# Patient Record
Sex: Female | Born: 1965 | Race: Black or African American | Hispanic: No | Smoking: Never smoker
Health system: Southern US, Community
[De-identification: ages and names within clinical notes are randomized; demographics above are authoritative.]

## PROBLEM LIST (undated history)

## (undated) DIAGNOSIS — I10 Essential (primary) hypertension: Secondary | ICD-10-CM

---

## 2001-08-03 ENCOUNTER — Other Ambulatory Visit: Admission: RE | Admit: 2001-08-03 | Discharge: 2001-08-03 | Payer: Self-pay | Admitting: *Deleted

## 2001-08-30 ENCOUNTER — Encounter (INDEPENDENT_AMBULATORY_CARE_PROVIDER_SITE_OTHER): Payer: Self-pay

## 2001-08-30 ENCOUNTER — Observation Stay (HOSPITAL_COMMUNITY): Admission: AD | Admit: 2001-08-30 | Discharge: 2001-08-31 | Payer: Self-pay | Admitting: *Deleted

## 2001-08-31 ENCOUNTER — Encounter: Payer: Self-pay | Admitting: Obstetrics and Gynecology

## 2002-03-22 ENCOUNTER — Inpatient Hospital Stay (HOSPITAL_COMMUNITY): Admission: AD | Admit: 2002-03-22 | Discharge: 2002-03-25 | Payer: Self-pay | Admitting: *Deleted

## 2002-03-22 ENCOUNTER — Encounter: Payer: Self-pay | Admitting: *Deleted

## 2002-03-24 ENCOUNTER — Encounter (INDEPENDENT_AMBULATORY_CARE_PROVIDER_SITE_OTHER): Payer: Self-pay

## 2003-07-20 ENCOUNTER — Ambulatory Visit (HOSPITAL_COMMUNITY): Admission: RE | Admit: 2003-07-20 | Discharge: 2003-07-20 | Payer: Self-pay | Admitting: Obstetrics

## 2003-08-18 ENCOUNTER — Ambulatory Visit (HOSPITAL_COMMUNITY): Admission: RE | Admit: 2003-08-18 | Discharge: 2003-08-18 | Payer: Self-pay | Admitting: Obstetrics

## 2003-10-03 ENCOUNTER — Ambulatory Visit (HOSPITAL_COMMUNITY): Admission: RE | Admit: 2003-10-03 | Discharge: 2003-10-03 | Payer: Self-pay | Admitting: Obstetrics

## 2003-11-15 ENCOUNTER — Inpatient Hospital Stay (HOSPITAL_COMMUNITY): Admission: AD | Admit: 2003-11-15 | Discharge: 2003-11-26 | Payer: Self-pay | Admitting: Obstetrics

## 2003-11-24 ENCOUNTER — Ambulatory Visit: Payer: Self-pay | Admitting: Neonatology

## 2007-08-19 ENCOUNTER — Emergency Department (HOSPITAL_COMMUNITY): Admission: EM | Admit: 2007-08-19 | Discharge: 2007-08-19 | Payer: Self-pay | Admitting: Emergency Medicine

## 2007-09-10 DIAGNOSIS — I10 Essential (primary) hypertension: Secondary | ICD-10-CM | POA: Insufficient documentation

## 2008-09-18 ENCOUNTER — Emergency Department (HOSPITAL_COMMUNITY): Admission: EM | Admit: 2008-09-18 | Discharge: 2008-09-18 | Payer: Self-pay | Admitting: Emergency Medicine

## 2009-07-11 ENCOUNTER — Ambulatory Visit (HOSPITAL_COMMUNITY): Admission: RE | Admit: 2009-07-11 | Discharge: 2009-07-11 | Payer: Self-pay | Admitting: Obstetrics

## 2009-07-18 ENCOUNTER — Encounter: Admission: RE | Admit: 2009-07-18 | Discharge: 2009-07-18 | Payer: Self-pay | Admitting: Obstetrics

## 2010-04-07 ENCOUNTER — Encounter: Payer: Self-pay | Admitting: Obstetrics

## 2010-08-02 NOTE — Discharge Summary (Signed)
Cynthia Clarke, Cynthia Clarke               ACCOUNT NO.:  [id number]   MEDICAL RECORD NO.:  [id number]          PATIENT TYPE:  INP   LOCATION:  9159                          F[facility]ILITY:  [facility]   PHYSICIAN:  Charles A. Clearance Coots, M.D.DATE OF BIRTH:  [birth year]   DATE OF ADMISSION:  11/15/2003  DATE OF DISCHARGE:  11/26/2003                                 DISCHARGE SUMMARY   ADMITTING DIAGNOSES:  1.  Twenty-four weeks gestation.  2.  Twins.  3.  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  4.  Rule out rupture of membranes.  5.  Preterm cervical changes.   DISCHARGE DIAGNOSES:  1.  Twenty-four weeks gestation.  2.  Twins.  3.  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  4.  Rule out rupture of membranes.  5.  Preterm cervical changes.  6.  Status post rupture of membranes.  Transferred to [facility],      [place].   REASON FOR ADMISSION:  A 45 year old black female G9 P1-0-7-1 at [redacted] weeks  gestation with twin gestation and incompetent cervix.  Cervical cerclage in  place.  Ultrasound on the day of admission revealed funneling of the cervix  and decreased amniotic fluid index of baby A.  The patient had no complaints  of leaking of fluid or uterine contractions.  The patient's obstetrical  history is significant for multiple spontaneous abortions.   PAST MEDICAL HISTORY:  Surgery:  Cerclage in June 2005.  Illnesses:  None.   MEDICATIONS:  Prenatal vitamins, Delalutin.   ALLERGIES:  PENICILLIN.   SOCIAL HISTORY:  Married.  Negative tobacco, alcohol, or recreational drug  use.   PHYSICAL EXAMINATION:  GENERAL:  Well-nourished, well-developed black female  in no acute distress.  VITAL SIGNS:  Temperature 98.5, pulse 85, respiratory rate 20, blood  pressure 128/71.  HEENT:  Normal.  LUNGS:  Clear to auscultation bilaterally.  HEART:  Regular rate and rhythm.  ABDOMEN:  Gravid, nontender.  PELVIC:  Normal external female  genitalia.  Speculum exam revealed no  pooling of amniotic fluid and the discharge that was noted was nitrazine  negative.  The cervix appeared closed on speculum exam and the cerclage  stitch could be seen at the 12 o'clock position.  Group B strep was done.   LABORATORY VALUES:  Hemoglobin 11.4; hematocrit 34; white blood cell count  9900; platelets 213,000.  Urinalysis revealed a trace hemoglobin, otherwise  was within normal limits.   IMPRESSION:  Twenty-four weeks gestation, twins, incompetent cervix, preterm  cervical changes.   PLAN:  Admit, start magnesium sulfate, bedrest.   HOSPITAL COURSE:  The patient was admitted and started on IV magnesium  sulfate, responded quite well to therapy, and actually did well on magnesium  sulfate until hospital day #11, when she got up to go to the bathroom and  noted leaking of brownish-tinged fluid from the vagina.  Sterile speculum  exam revealed pooling of brownish-tinged purulent fluid from the cervix that  was nitrazine positive and fern positive.  The cervix appeared closed and  the cerclage stitch was intact.  The patient was now [redacted] weeks gestation and  the neonatal intensive care unit at the [facility] was full at the  time, and the Physicians Access Line was initiated for transfer of the  patient with twins intact to [facility] not in labor.  The patient was  transferred by EMS to [facility] without complications.   DISCHARGE LABORATORY VALUES:  Urine culture was no growth.  Group B strep  culture was positive.   DISCHARGE DISPOSITION:  The patient transferred to [facility] in good  condition, not in labor, for further management and probable delivery.     Char   CAH/MEDQ  D:  01/03/2004  T:  01/03/2004  Job:  [id number]

## 2010-08-02 NOTE — Op Note (Signed)
[facility]  Patient:    FALEN, LEHRMANN Visit Number: [id number] MRN: [id number]          Service Type: OBS Location: 910B 9151 01 Attending Physician:  Rhina Brackett Dictated by:   Duke Salvia. Marcelle Overlie, M.D. Proc. Date: 08/31/01 Admit Date:  08/30/2001 Discharge Date: 08/31/2001                             Operative Report  PREOPERATIVE DIAGNOSIS:       A 15 week incomplete abortion.  POSTOPERATIVE DIAGNOSIS:      A 15 week incomplete abortion.  OPERATION:                    D&E.  SURGEON:                      Duke Salvia. Marcelle Overlie, M.D.  ANESTHESIA:                   Spinal.  COMPLICATIONS:                None.  DRAINS:                       In and out Foley catheter.  ESTIMATED BLOOD LOSS:         300 including all blood clot.  DESCRIPTION OF PROCEDURE AND FINDINGS:                 The patient was taken to the operating room and after an adequate level of spinal anesthetic was obtained.  The patients legs were placed in stirrups.  The bladder was drained.  EUA was carried out.  The uterus was 14 weeks size and mid position.  The cord was noted at the cervix. After prepping and draping, the cervix was grasped with a tenaculum.  Ring forceps then used to remove a large amount of tissue including most of the placenta intact.  A large Lounsbury curet was then used to curet the cavity. The walls were firm.  A #12 suction curet was then used to remove minimal tissue.  Due to some moderate brisk bleeding, she was Hemabate 250 IM in the operating room, but with good firmness of the uterus.  There were no complications.  She went to the recovery room in good condition. Dictated by:   Duke Salvia. Marcelle Overlie, M.D. Attending Physician:  Rhina Brackett DD:  08/31/01 TD:  09/01/01 Job: [id number]

## 2010-08-02 NOTE — Op Note (Signed)
NAMEMARIAN, MENEELY NO.:  [id number]   MEDICAL RECORD NO.:  [id number]                   PATIENT TYPE:  INP   LOCATION:  9309                                 FACILITY:  [facility]   PHYSICIAN:  Mary Sella. Orlene Erm, M.D.                 DATE OF BIRTH:  06/12/1965   DATE OF PROCEDURE:  03/24/2002  DATE OF DISCHARGE:                                 OPERATIVE REPORT   PREOPERATIVE DIAGNOSIS:  This 45 year old black female with retained  placenta, status post delivery of a nonviable 18-week fetus.   POSTOPERATIVE DIAGNOSIS:  This 45 year old black female with retained  placenta, status post delivery of a nonviable 18-week fetus.   PROCEDURE:  Dilation and curettage.   SURGEON:  Mary Sella. Orlene Erm, M.D.   ANESTHESIA:  MAC.   COMPLICATIONS:  None.   SPECIMENS:  Placenta and products of conception.   ESTIMATED BLOOD LOSS:  150 cc.   DISPOSITION:  Recovery room, stable.   INDICATIONS FOR PROCEDURE:  The patient is a 45 year old gravida 7, para 1-0-  5-1, status post rupture of membranes and delivery of a 18-week fetus.  She  delivered the fetus at approximately 9:30 on the day of surgery and Pitocin  and Cytotec were given to the patient over the ensuing six hours.  She  continued to have a retained placenta without evidence of any separation.  The patient desired definitive therapy with dilatation and curettage.   The patient was counseled on the risks of the procedure including the risk  of bleeding, infection, injury to internal organs, perforation of the  uterus, and Asherman's syndrome.  The patient understands these and wishes  to proceed.   PROCEDURE:  The patient was taken to the operating room where she was given  MAC anesthesia and placed in Allen stirrups.  The speculum was placed in the  vagina and a large mass of the placenta was seen at the cervix.  This was  pieced through the cervix easily and handed off for pathology.  The placenta  did not  appear to be intact and an __________ curette was introduced through  an already dilated cervix and portions of necrotic-appearing placenta were  scraped from the posterior lower uterine segment.  A suction curette was  introduced through the uterine cavity and remained products of conception  were evacuated.  The uterus was massaged and the patient was given  Methergine intraoperatively to minimize further bleeding.  The patient  tolerated the procedure well, was awakened, and taken back to the Adult  Intensive Care Unit for further evaluation and recovery.                                              Mary Sella. Orlene Erm, M.D.   EMH/MEDQ  D:  03/24/2002  T:  03/24/2002  Job:  [id number]

## 2010-08-02 NOTE — Op Note (Signed)
NAME:  Cynthia Clarke, Cynthia Clarke                         ACCOUNT NO.:  [id number]   MEDICAL RECORD NO.:  [id number]                   PATIENT TYPE:  AMB   LOCATION:  SDC                                  [facility]   PHYSICIAN:  Charles A. Clearance Coots, M.D.             DATE OF BIRTH:  29-May-1965   DATE OF PROCEDURE:  08/18/2003  DATE OF DISCHARGE:                                 OPERATIVE REPORT   PREOPERATIVE DIAGNOSIS:  Incompetent cervix.   POSTOPERATIVE DIAGNOSIS:  Incompetent cervix.   PROCEDURE:  McDonald's cervical cerclage.   SURGEON:  Charles A. Clearance Coots, M.D.   ANESTHESIA:  Spinal.   ESTIMATED BLOOD LOSS:  Negligible.   COMPLICATIONS:  None.   SPECIMENS:  None.   OPERATION:  The patient was brought to the operating room and after  satisfactory spinal anesthesia, the vagina was prepped and draped in the  usual sterile fashion.  A sterile weighted speculum was then placed in the  posterior vaginal vault and the cervix was isolated with the aid of a Sims  retractor.  The anterior lip of the cervix was grasped with a small ring  forceps and a McDonald cervical cerclage was then placed near the internal  os of the cervix in routine fashion with #4 double throw of silk suture with  medium needle.  The suture was tied securely at the 12 o'clock position with  several throws of the knot and tail of at least one inch.  There was no  active bleeding or leakage of fluid at the conclusion of the procedure.  The  patient tolerated the procedure well and was transferred to the recovery  room in satisfactory condition.                                               Charles A. Clearance Coots, M.D.    CAH/MEDQ  D:  08/18/2003  T:  08/18/2003  Job:  [id number]

## 2010-08-02 NOTE — H&P (Signed)
[facility]  Patient:    Cynthia Clarke, Cynthia Clarke Visit Number: [id number] MRN: [id number]          Service Type: OBS Location: 910B 9151 01 Attending Physician:  Rhina Brackett Dictated by:   Duke Salvia. Marcelle Overlie, M.D. Admit Date:  08/30/2001 Discharge Date: 08/31/2001                           History and Physical  CHIEF COMPLAINT:              Leaking of fluid and bleeding.  HISTORY OF PRESENT ILLNESS:   A 45 year old G2 P1 presents with a history of leaking and having some leaking watery fluid and some light bleeding at [redacted] weeks gestation.  On presentation to triage the patient was stable, was sent to ultrasound.  In preparation for the ultrasound she was in the bathroom and delivered the fetus in the commode.  Back in triage, an IV was started.  There was minimal bleeding.  She was given a dose of Cytotec and observed over three to four hours with no passage of a placenta.  Decision was made to proceed with D&E at that point.  She has had one term pregnancy.  PAST MEDICAL HISTORY:         Blood type A positive, antibody screen negative. RPR was minimally reactive on Jul 23, 2001.  This was noted on the prenatal record and treated at that time.  REVIEW OF SYSTEMS:            Otherwise negative.  PHYSICAL EXAMINATION:  VITAL SIGNS:                  Temperature 98.2, blood pressure 127/69.  HEENT:                        Unremarkable.  NECK:                         Supple without masses.  LUNGS:                        Clear.  CARDIOVASCULAR:               Regular rate and rhythm without murmurs, rubs, or gallops noted.  BREASTS:                      Not examined.  ABDOMEN:                      The fundus was two fingerbreadths below the symphysis.  PELVIC:                       Cervix revealed the cord was clamped, minimal bleeding.  EXTREMITIES AND NEUROLOGIC:   Unremarkable.  IMPRESSION:                   1. Intrauterine pregnancy  at 16 weeks.                               2. Incomplete abortion.  PLAN:                         D&E. Procedure including infection risk, risk of transfusion, perforation that  may require additional surgery all discussed with her, which she understands and accepts. Dictated by:   Duke Salvia. Marcelle Overlie, M.D. Attending Physician:  Rhina Brackett DD:  08/31/01 TD:  08/31/01 Job: [id number]

## 2010-08-02 NOTE — Discharge Summary (Signed)
   [name], [name] NO.:  [id number]   MEDICAL RECORD NO.:  [id number]                   PATIENT TYPE:  INP   LOCATION:  9309                                 [facility]  [facility]   PHYSICIAN:  Conni Elliot, M.D.             DATE OF BIRTH:  Jun 16, 1965   DATE OF ADMISSION:  03/22/2002  DATE OF DISCHARGE:  03/25/2002                                 DISCHARGE SUMMARY   DISCHARGE DIAGNOSES:  1. Spontaneous abortion at 15-18 weeks secondary to preterm premature     rupture of membranes.  2. Status post dilatation and evacuation.   DISCHARGE MEDICATIONS:  Augmentin 875 mg p.o. b.i.d. x7 days.   BRIEF HOSPITAL COURSE:  This is a 45 year old G7 P1-0-6-1 who presented  between 15-18 weeks with a two-day history of leaking yellow fluid.  She was  found upon admission to have PPROM with a positive fern test.  Consequently,  she was admitted and induction of labor was begun with Cytotec.  On March 24, 2002 at 9:37 a.m. she delivered a nonviable fetus.  The placenta and cord  were attempted to be delivered expectantly but after multiple hours it was  decided that a D&E would be performed.  This procedure was performed on  March 24, 2002 without any apparent complications.  The patient was feeling  well on her day of discharge - March 25, 2002.  She was only having very  minimal abdominal pain and was desiring to go home.  For contraception she  desired a bilateral tubal ligation and the papers will be signed prior to  her discharge.  She will receive a Depo injection on the day of discharge.   ACTIVITY:  No heavy lifting or strenuous activity for two weeks.   SPECIAL INSTRUCTIONS:  The patient was advised to return to [facility]  for fevers greater than 100.5, worsening abdominal pain, or increased amount  of bleeding.   FOLLOW-UP DATE:  Gynecology clinic in four weeks.  The patient was given the  phone number [phone] and advised to call if she  had been not called with an  appointment within the next day or two.     Rodolph Bong, M.D.                        Conni Elliot, M.D.    AK/MEDQ  D:  03/25/2002  T:  03/26/2002  Job:  [id number]   cc:   [facility] outpatient Gynecology Clinc

## 2010-12-12 LAB — URINALYSIS, ROUTINE W REFLEX MICROSCOPIC
Glucose, UA: NEGATIVE
Ketones, ur: 15 — AB
Leukocytes, UA: NEGATIVE
Protein, ur: NEGATIVE
Specific Gravity, Urine: 1.027

## 2010-12-12 LAB — URINE MICROSCOPIC-ADD ON

## 2010-12-12 LAB — POCT PREGNANCY, URINE: Preg Test, Ur: NEGATIVE

## 2013-01-14 ENCOUNTER — Encounter (HOSPITAL_COMMUNITY): Payer: Self-pay | Admitting: Emergency Medicine

## 2013-01-14 ENCOUNTER — Emergency Department (INDEPENDENT_AMBULATORY_CARE_PROVIDER_SITE_OTHER)
Admission: EM | Admit: 2013-01-14 | Discharge: 2013-01-14 | Disposition: A | Discharge status: Home / Self Care | Payer: Self-pay | Source: Home / Self Care | Attending: Emergency Medicine | Admitting: Emergency Medicine

## 2013-01-14 DIAGNOSIS — I10 Essential (primary) hypertension: Secondary | ICD-10-CM

## 2013-01-14 DIAGNOSIS — J069 Acute upper respiratory infection, unspecified: Secondary | ICD-10-CM

## 2013-01-14 LAB — POCT I-STAT, CHEM 8
BUN: 15 mg/dL (ref 6–23)
Calcium, Ion: 1.28 mmol/L — ABNORMAL HIGH (ref 1.12–1.23)
Chloride: 106 mEq/L (ref 96–112)
Creatinine, Ser: 0.9 mg/dL (ref 0.50–1.10)
Glucose, Bld: 101 mg/dL — ABNORMAL HIGH (ref 70–99)
Hemoglobin: 15.3 g/dL — ABNORMAL HIGH (ref 12.0–15.0)
Sodium: 142 mEq/L (ref 135–145)
TCO2: 26 mmol/L (ref 0–100)

## 2013-01-14 MED ORDER — BENZONATATE 200 MG PO CAPS: 200.0000 mg | ORAL_CAPSULE | Freq: Three times a day (TID) | ORAL | Status: AC | PRN

## 2013-01-14 MED ORDER — FEXOFENADINE HCL 180 MG PO TABS: 180.0000 mg | ORAL_TABLET | Freq: Every day | ORAL | Status: AC

## 2013-01-14 MED ORDER — LISINOPRIL-HYDROCHLOROTHIAZIDE 20-25 MG PO TABS: 1.0000 | ORAL_TABLET | Freq: Every day | ORAL | Status: DC

## 2013-01-14 NOTE — ED Provider Notes (Signed)
Chief Complaint:   Chief Complaint  Patient presents with  . URI    History of Present Illness:   Cynthia Clarke is a 47 year old female who presents today for high blood pressure and URI symptoms.  1. Hypertension: This was diagnosed about 9 years ago after the birth of her child. She had been on medications, but lost her primary care physician due to inability to pay. She's been out of all meds for about a year. She doesn't remember what medication she was on. She denies any medication side effects in the past. She's had no headaches or dizziness. Her vision is blurry and she feels short of breath. She's had no chest pain, tightness, or pressure. She denies any ankle edema or strokelike symptoms. No history of kidney disease, diabetes, elevated cholesterol, or cigarette smoking.  2. Upper respiratory symptoms: This is been going on for about a week. She's had sore throat, nasal congestion, headache, sinus pressure, and right ear pressure. She's also had a cough productive of clear to sputum and some posttussive nausea. She felt hot and cold and breaks out in sweats. She has difficulty sleeping, feels dizzy, lightheaded, and has had some aching in her right shoulder. She also has a Mirena that needs to come out. She states he should come out a year and a half ago.  Review of Systems:  Other than noted above, the patient denies any of the following symptoms. Systemic:  No fever, chills, sweats, fatigue, myalgias, headache, or anorexia. Eye:  No redness, pain or drainage. ENT:  No earache, nasal congestion, rhinorrhea, sinus pressure, or sore throat. Lungs:  No cough, sputum production, wheezing, shortness of breath.  Cardiovascular:  No chest pain, palpitations, or syncope. GI:  No nausea, vomiting, abdominal pain or diarrhea. GU:  No dysuria, frequency, or hematuria. Skin:  No rash or pruritis.  PMFSH:  Past medical history, family history, social history, meds, and allergies were reviewed.   She is allergic to penicillin. She takes no medications currently. She has no primary care physician and does not have medical insurance.  Physical Exam:   Vital signs:  BP 187/108  Pulse 92  Temp(Src) 97.8 F (36.6 C) (Oral)  Resp 16  LMP 12/24/2012 General:  Alert, in no distress. Eye:  PERRL, full EOMs.  Lids and conjunctivas were normal. ENT:  TMs and canals were normal, without erythema or inflammation.  Nasal mucosa was clear and uncongested, without drainage.  Mucous membranes were moist.  Pharynx was clear, without exudate or drainage.  There were no oral ulcerations or lesions. Neck:  Supple, no adenopathy, tenderness or mass. Thyroid was normal. Lungs:  No respiratory distress.  Lungs were clear to auscultation, without wheezes, rales or rhonchi.  Breath sounds were clear and equal bilaterally. Heart:  Regular rhythm, without gallops, murmers or rubs. Abdomen:  Soft, flat, and non-tender to palpation.  No hepatosplenomagaly or mass. Skin:  Clear, warm, and dry, without rash or lesions.  Labs:   Results for orders placed during the hospital encounter of 01/14/13  POCT I-STAT, CHEM 8      Result Value Range   Sodium 142  135 - 145 mEq/L   Potassium 4.5  3.5 - 5.1 mEq/L   Chloride 106  96 - 112 mEq/L   BUN 15  6 - 23 mg/dL   Creatinine, Ser 4.54  0.50 - 1.10 mg/dL   Glucose, Bld 098 (*) 70 - 99 mg/dL   Calcium, Ion 1.19 (*) 1.12 - 1.23  mmol/L   TCO2 26  0 - 100 mmol/L   Hemoglobin 15.3 (*) 12.0 - 15.0 g/dL   HCT 40.9  81.1 - 91.4 %    Assessment:  The primary encounter diagnosis was Hypertension. A diagnosis of Viral URI was also pertinent to this visit.  Blood pressure is markedly elevated today and she'll need followup with a week or 2. She was given the phone number of [facility] [employer].  Plan:   1.  Meds:  The following meds were prescribed:   Discharge Medication List as of 01/14/2013 12:49 PM    START taking these medications   Details   benzonatate (TESSALON) 200 MG capsule Take 1 capsule (200 mg total) by mouth 3 (three) times daily as needed for cough., Starting 01/14/2013, Until Discontinued, Normal    fexofenadine (ALLEGRA) 180 MG tablet Take 1 tablet (180 mg total) by mouth daily., Starting 01/14/2013, Until Discontinued, Normal    lisinopril-hydrochlorothiazide (PRINZIDE,ZESTORETIC) 20-25 MG per tablet Take 1 tablet by mouth daily., Starting 01/14/2013, Until Discontinued, Normal        2.  Patient Education/Counseling:  The patient was given appropriate handouts, self care instructions, and instructed in symptomatic relief.  Discussed salt and sodium restriction.  3.  Follow up:  The patient was told to follow up if no better in 3 to 4 days, if becoming worse in any way, and given some red flag symptoms such as fever, shortness of breath, or chest pain which would prompt immediate return.  Follow up with [facility] within the next one to 2 weeks.         Reuben Likes, MD 01/14/13 [phone]

## 2013-01-14 NOTE — ED Notes (Signed)
C/o cough with clear sputum. Runny nose. Body aches. Sore throat. Denies n/v/d.  Used otc meds with no relief.   States that she has been out of BP meds for a very long time due to insurance. Pt has also been having dizzy spells and BP has been elevated.  Denies chest pain with sob.

## 2013-01-14 NOTE — Discharge Instructions (Signed)
Blood pressure over the ideal can put you at higher risk for stroke, heart disease, and kidney failure.  For this reason, it's important to try to get your blood pressure as close as possible to the ideal.  The ideal blood pressure is 120/80.  Blood pressures from 469-629 systolic over 52-84 diastolic are labeled as "prehypertension."  This means you are at higher risk of developing hypertension in the future.  Blood pressures in this range are not treated with medication, but lifestyle changes are recommended to prevent progression to hypertension.  Blood pressures of 132 and above systolic over 90 and above diastolic are classified as hypertension and are treated with medications.  Lifestyle changes which can benefit both prehypertension and hypertension include the following:   Salt and sodium restriction.  Weight loss.  Regular exercise.  Avoidance of tobacco.  Avoidance of excess alcohol.  The "D.A.S.H" diet.   People with hypertension and prehypertension should limit their salt intake to less than 1500 mg daily.  Reading the nutrition information on the label of many prepared foods can give you an idea of how much sodium you're consuming at each meal.  Remember that the most important number on the nutrition information is the serving size.  It may be smaller than you think.  Try to avoid adding extra salt at the table.  You may add small amounts of salt while cooking.  Remember that salt is an acquired taste and you may get used to a using a whole lot less salt than you are using now.  Using less salt lets the food's natural flavors come through.  You might want to consider using salt substitutes, potassium chloride, pepper, or blends of herbs and spices to enhance the flavor of your food.  Foods that contain the most salt include: processed meats (like ham, bacon, lunch meat, sausage, hot dogs, and breakfast meat), chips, pretzels, salted nuts, soups, salty snacks, canned foods, junk  food, fast food, restaurant food, mustard, pickles, pizza, popcorn, soy sauce, and worcestershire sauce--quite a list!  You might ask, "Is there anything I can eat?"  The answer is, "yes."  Fruits and vegetables are usually low in salt.  Fresh is better than frozen which is better than canned.  If you have canned vegetables, you can cut down on the salt content by rinsing them in tap water 3 times before cooking.     Weight loss is the second thing you can do to lower your blood pressure.  Getting to and maintaining ideal weight will often normalize your blood pressure and allow you to avoid medications, entirely, cut way down on your dosage of medications, or allow to wean off your meds.  (Note, this should only be done under the supervision of your primary care doctor.)  Of course, weight loss takes time and you may need to be on medication in the meantime.  You shoot for a body mass index of 20-25.  When you go to the urgent care or to your primary care doctor, they should calculate your BMI.  If you don't know what it is, ask.  You can calculate your BMI with the following formula:  Weight in pounds x 703/ (height in inches) x (height in inches).  There are many good diets out there: Weight Watchers and the D.A.S.H. Diet are the best, but often, just modifying a few factors can be helpful:  Don't skip meals, don't eat out, and keeping a food diary.  I do not recommend  fad diets or diet pills which often raise blood pressure.    Everyone should get regular exercise, but this is particularly important for people with high blood pressure.  Just about any exercise is good.  The only exercise which may be harmful is lifting extreme heavy weights.  I recommend moderate exercise such as walking for 30 minutes 5 days a week.  Going to the gym for a 50 minute workout 3 times a week is also good.  This amounts to 150 minutes of exercise weekly.   Anyone with high blood pressure should avoid any use of tobacco.   Tobacco use does not elevate blood pressure, but it increases the risk of heart disease and stroke.  If you are interested in quitting, discuss with your doctor how to quit.  If you are not interested in quitting, ask yourself, "What would my life be like in 10 years if I continue to smoke?"  "How will I know when it is time to quit?"  "How would my life be better if I were to quit."   Excess alcohol intake can raise the blood pressure.  The safe alcohol intake is 2 drinks or less per day for men and 1 drink per day or less for women.   There is a very good diet which I recommend that has been designed for people with blood pressure called the D.A.S.H. Diet (dietary approaches to stop hypertension).  It consists of fruits, vegetables, lean meats, low fat dairy, whole grains, nuts and seeds.  It is very low in salt and sodium.  It has also been found to have other beneficial health effects such as lowering cholesterol and helping lose weight.  It has been developed by the [employer] and can be downloaded from the internet without any cost. Just do a Programmer, multimedia on "D.A.S.H. Diet." or go the NIH website (GolfingPosters.tn).  There are also cookbooks and diet plans that can be gotten from [place] to help you with this diet.   Most upper respiratory infections are caused by viruses and do not require antibiotics.  We try to save the antibiotics for when we really need them to avoid resistance.  This does not mean that there is nothing that can be done.  Here are a few hints about things that can be done at home to get over an upper respiratory infection quicker:  Get extra sleep and extra fluids.  Get 7 to 9 hours of sleep per night and 6 to 8 glasses of water a day.  Getting extra sleep keeps the immune system from getting run down.  Most people with an upper respiratory infection are a little dehydrated.  The extra fluids also keep the secretions liquified and easier to deal with.  Also, get  extra vitamin C.  4000 mg per day is the recommended dose. For the aches, headache, and fever, acetaminophen or ibuprofen are helpful.  These can be alternated every 4 hours.  People with liver disease should avoid large amounts of acetaminophen, and people with ulcer disease, gastroesophageal reflux, gastritis, congestive heart failure, chronic kidney disease, coronary artery disease and the elderly should avoid ibuprofen. For nasal congestion try Mucinex-D, or if you're having lots of sneezing or copious clear nasal drainage Allegra-D-24 hour.  A Saline nasal spray such as Ocean Spray can also help as can decongestant sprays such as Afrin, but you should not use the decongestant sprays for more than 3 or 4 days since they can  be habituating.  If nasal dryness is a problem, Ayr Nasal Gel can help moisturize your nasal passages.  Breath Rite nasal strips can also offer a non-drug alternative treatment to nasal congestion, especially at night. For people with symptoms of sinusitis, sleeping with your head elevated can be helpful.  For sinus pain, moist, hot compresses to the face may provide some relief.  Many people find that inhaling steam as in a shower or from a pot of steaming water can help. For sore throat, zinc containing lozenges such as Cold-Eze or Zicam are helpful.  Zinc helps to fight infection and has a mild astringent effect that relieves the sore, achey throat.  Hot salt water gargles (8 oz of hot water, 1/2 tsp of table salt, and a pinch of baking soda) can give relief as well as hot beverages such as hot tea. For the cough, old time remedies such as honey or honey and lemon are tried and true.  Over the counter cough syrups such as Delsym 2 tsp every 12 hours can help as well.  It has also been found recently that Aleve can help control a cough.  The dose is 1 to 2 tablets twice daily with food.  This can be combined with Delsym. (Note, if you are taking ibuprofen, you should not take Aleve as  well--take one or the other.)  It's important when you have an upper respiratory infection not to pass the infection to others.  This involves being very careful about the following:  Frequent hand washing or use of hand sanitizer, especially after coughing, sneezing, blowing your nose or touching your face, nose or eyes. Do not shake hands or touch anyone and try to avoid touching surfaces that other people use such as doorknobs, shopping carts, telephones and computer keyboards. Use tissues and dispose of them properly in a garbage can or ziplock bag. Cough into your sleeve. Do not let others eat or drink after you.  It's also important to recognize the signs of serious illness and get evaluated if they occur: Any respiratory infection that lasts more than 7 to 10 days.  Yellow nasal drainage and sputum are not reliable indicators of a bacterial infection, but if they last for more than 1 week, see your doctor. Fever and sore throat can indicate strep. Fever and cough can indicate influenza or pneumonia. Any kind of severe symptom such as difficulty breathing, intractable vomiting, or severe pain should prompt you to see a doctor as soon as possible.   Your body's immune system is really the thing that will get rid of this infection.  Your immune system is comprised of 2 types of specialized cells called T cells and B cells.  T cells coordinate the array of cells in your body that engulf invading bacteria or viruses while B cells orchestrate the production of antibodies that neutralize infection.  Anything we do or any medications we give you, will just strengthen your immune system or help it clear up the infection quicker.  Here are a few helpful hints to improve your immune system to help overcome this illness or to prevent future infections:  A few vitamins can improve the health of your immune system.  That's why your diet should include plenty of fruits, vegetables, fish, nuts, and whole  grains.  Vitamin A and bet-carotene can increase the cells that fight infections (T cells and B cells).  Vitamin A is abundant in dark greens and orange vegetables such as spinach, greens,  sweet potatoes, and carrots.  Vitamin B6 contributes to the maturation of white blood cells, the cells that fight disease.  Foods with vitamin B6 include cold cereal and bananas.  Vitamin C is credited with preventing colds because it increases white blood cells and also prevents cellular damage.  Citrus fruits, peaches and green and red bell peppers are all hight in vitamin C.  Vitamin E is an anti-oxidant that encourages the production of natural killer cells which reject foreign invaders and B cells that produce antibodies.  Foods high in vitamin E include wheat germ, nuts and seeds.  Foods high in omega-3 fatty acids found in foods like salmon, tuna and mackerel boost your immune system and help cells to engulf and absorb germs.  Probiotics are good bacteria that increase your T cells.  These can be found in yogurt and are available in supplements such as Culturelle or Align.  Moderate exercise increases the strength of your immune system and your ability to recover from illness.  I suggest 3 to 5 moderate intensity 30 minute workouts per week.    Sleep is another component of maintaining a strong immune system.  It enables your body to recuperate from the day's activities, stress and work.  My recommendation is to get between 7 and 9 hours of sleep per night.  If you smoke, try to quit completely or at least cut down.  Drink alcohol only in moderation if at all.  No more than 2 drinks daily for men or 1 for women.  Get a flu vaccine early in the fall or if you have not gotten one yet, once this illness has run its course.  If you are over 65, a smoker, or an asthmatic, get a pneumococcal vaccine.  My final recommendation is to maintain a healthy weight.  Excess weight can impair the immune system by  interfering with the way the immune system deals with invading viruses or bacteria.

## 2013-01-27 ENCOUNTER — Ambulatory Visit: Payer: Self-pay | Attending: Internal Medicine

## 2013-02-16 ENCOUNTER — Ambulatory Visit: Payer: Self-pay

## 2013-02-25 ENCOUNTER — Ambulatory Visit: Payer: Self-pay

## 2013-05-26 ENCOUNTER — Ambulatory Visit: Payer: Self-pay | Admitting: Obstetrics

## 2014-03-16 ENCOUNTER — Emergency Department (INDEPENDENT_AMBULATORY_CARE_PROVIDER_SITE_OTHER)
Admission: EM | Admit: 2014-03-16 | Discharge: 2014-03-16 | Disposition: A | Discharge status: Home / Self Care | Payer: Self-pay | Source: Home / Self Care | Attending: Emergency Medicine | Admitting: Emergency Medicine

## 2014-03-16 ENCOUNTER — Encounter (HOSPITAL_COMMUNITY): Payer: Self-pay | Admitting: Emergency Medicine

## 2014-03-16 DIAGNOSIS — H6593 Unspecified nonsuppurative otitis media, bilateral: Secondary | ICD-10-CM

## 2014-03-16 MED ORDER — LISINOPRIL-HYDROCHLOROTHIAZIDE 20-25 MG PO TABS: 1.0000 | ORAL_TABLET | Freq: Every day | ORAL | Status: AC

## 2014-03-16 MED ORDER — FLUCONAZOLE 150 MG PO TABS: 150.0000 mg | ORAL_TABLET | Freq: Once | ORAL | Status: AC

## 2014-03-16 MED ORDER — PREDNISONE 20 MG PO TABS: ORAL_TABLET | ORAL | Status: AC

## 2014-03-16 MED ORDER — DOXYCYCLINE HYCLATE 100 MG PO TABS: 100.0000 mg | ORAL_TABLET | Freq: Two times a day (BID) | ORAL | Status: DC

## 2014-03-16 NOTE — ED Notes (Signed)
C/o  Productive cough with yellow sputum.   Decreased hearing in both ears, denies pain and drainage.   No sob/chest pain.   No relief with otc meds or home remedies.   Symptoms present x 2 wks.

## 2014-03-16 NOTE — Discharge Instructions (Signed)
Serous Otitis Media °Serous otitis media is fluid in the middle ear space. This space contains the bones for hearing and air. Air in the middle ear space helps to transmit sound.  °The air gets there through the eustachian tube. This tube goes from the back of the nose (nasopharynx) to the middle ear space. It keeps the pressure in the middle ear the same as the outside world. It also helps to drain fluid from the middle ear space. °CAUSES  °Serous otitis media occurs when the eustachian tube gets blocked. Blockage can come from: °· Ear infections. °· Colds and other upper respiratory infections. °· Allergies. °· Irritants such as cigarette smoke. °· Sudden changes in air pressure (such as descending in an airplane). °· Enlarged adenoids. °· A mass in the nasopharynx. °During colds and upper respiratory infections, the middle ear space can become temporarily filled with fluid. This can happen after an ear infection also. Once the infection clears, the fluid will generally drain out of the ear through the eustachian tube. If it does not, then serous otitis media occurs. °SIGNS AND SYMPTOMS  °· Hearing loss. °· A feeling of fullness in the ear, without pain. °· Young children may not show any symptoms but may show slight behavioral changes, such as agitation, ear pulling, or crying. °DIAGNOSIS  °Serous otitis media is diagnosed by an ear exam. Tests may be done to check on the movement of the eardrum. Hearing exams may also be done. °TREATMENT  °The fluid most often goes away without treatment. If allergy is the cause, allergy treatment may be helpful. Fluid that persists for several months may require minor surgery. A small tube is placed in the eardrum to: °· Drain the fluid. °· Restore the air in the middle ear space. °In certain situations, antibiotic medicines are used to avoid surgery. Surgery may be done to remove enlarged adenoids (if this is the cause). °HOME CARE INSTRUCTIONS  °· Keep children away from  tobacco smoke. °· Keep all follow-up visits as directed by your health care provider. °SEEK MEDICAL CARE IF:  °· Your hearing is not better in 3 months. °· Your hearing is worse. °· You have ear pain. °· You have drainage from the ear. °· You have dizziness. °· You have serous otitis media only in one ear or have any bleeding from your nose (epistaxis). °· You notice a lump on your neck. °MAKE SURE YOU: °· Understand these instructions.   °· Will watch your condition.   °· Will get help right away if you are not doing well or get worse.   °Document Released: 05/24/2003 Document Revised: 07/18/2013 Document Reviewed: 09/28/2012 °Exit[employer]® Patient Information ©2015 [employer]. This information is not intended to replace advice given to you by your health care provider. Make sure you discuss any questions you have with your health care provider. ° °

## 2014-03-16 NOTE — ED Provider Notes (Signed)
   Chief Complaint   URI and Cough   History of Present Illness   Cynthia Clarke is a 48 year old female who's had a two-week history of nasal congestion with clear rhinorrhea, headache, and cough productive of white sputum. Her main complaint is that both ears have felt congested and she has had difficulty hearing. She denies any ear pain or drainage. She's had no fever, chills, headache, stiff neck, difficulty breathing, or GI symptoms.  Review of Systems   Other than as noted above, the patient denies any of the following symptoms: Systemic:  No fevers, chills, sweats, or myalgias. Eye:  No redness or discharge. ENT:  No ear pain, headache, nasal congestion, drainage, sinus pressure, or sore throat. Neck:  No neck pain, stiffness, or swollen glands. Lungs:  No cough, sputum production, hemoptysis, wheezing, chest tightness, shortness of breath or chest pain. GI:  No abdominal pain, nausea, vomiting or diarrhea.  PMFSH   Past medical history, family history, social history, meds, and allergies were reviewed. She is allergic to penicillin. She has hypertension but is out of her meds right now. She normally takes lisinopril/hydrochlorothiazide.  Physical exam   Vital signs:  BP 186/104 mmHg  Pulse 77  Temp(Src) 98.3 F (36.8 C) (Oral)  Resp 18  SpO2 99%  LMP 03/16/2014 (Exact Date) General:  Alert and oriented.  In no distress.  Skin warm and dry. Eye:  No conjunctival injection or drainage. Lids were normal. ENT:  There was a small amount of cerumen up against the left TM. After this was irrigated clear, both TMs appear retracted and there appear to be fluid in the middle ear cavities bilaterally.  Nasal mucosa was clear and uncongested, without drainage.  Mucous membranes were moist.  Pharynx was clear with no exudate or drainage.  There were no oral ulcerations or lesions. Neck:  Supple, no adenopathy, tenderness or mass. Lungs:  No respiratory distress.  Lungs were clear to  auscultation, without wheezes, rales or rhonchi.  Breath sounds were clear and equal bilaterally.  Heart:  Regular rhythm, without gallops, murmers or rubs. Skin:  Clear, warm, and dry, without rash or lesions.   Assessment     The encounter diagnosis was Otitis media with effusion, bilateral.  Plan    1.  Meds:  The following meds were prescribed:   Discharge Medication List as of 03/16/2014 12:40 PM    START taking these medications   Details  doxycycline (VIBRA-TABS) 100 MG tablet Take 1 tablet (100 mg total) by mouth 2 (two) times daily., Starting 03/16/2014, Until Discontinued, Normal    fluconazole (DIFLUCAN) 150 MG tablet Take 1 tablet (150 mg total) by mouth once., Starting 03/16/2014, Normal    predniSONE (DELTASONE) 20 MG tablet Take 3 daily for 5 days, 2 daily for 5 days, 1 daily for 5 days., Normal        2.  Patient Education/Counseling:  The patient was given appropriate handouts, self care instructions, and instructed in symptomatic relief.  Instructed to get extra fluids and extra rest.    3.  Follow up:  The patient was told to follow up with ENT in 2 weeks, or sooner if becoming worse in any way, and given some red flag symptoms such as increasing fever, difficulty breathing, chest pain, or persistent vomiting which would prompt immediate return.       Reuben Likesavid C Lanell Carpenter, MD 03/16/14 [phone]

## 2016-03-06 ENCOUNTER — Emergency Department (HOSPITAL_COMMUNITY)
Admission: EM | Admit: 2016-03-06 | Discharge: 2016-03-06 | Disposition: A | Discharge status: Home / Self Care | Payer: No Typology Code available for payment source | Attending: Emergency Medicine | Admitting: Emergency Medicine

## 2016-03-06 ENCOUNTER — Emergency Department (HOSPITAL_COMMUNITY): Payer: No Typology Code available for payment source

## 2016-03-06 ENCOUNTER — Encounter (HOSPITAL_COMMUNITY): Payer: Self-pay | Admitting: Emergency Medicine

## 2016-03-06 DIAGNOSIS — S299XXA Unspecified injury of thorax, initial encounter: Secondary | ICD-10-CM | POA: Diagnosis present

## 2016-03-06 DIAGNOSIS — Y939 Activity, unspecified: Secondary | ICD-10-CM | POA: Diagnosis not present

## 2016-03-06 DIAGNOSIS — Y92410 Unspecified street and highway as the place of occurrence of the external cause: Secondary | ICD-10-CM | POA: Diagnosis not present

## 2016-03-06 DIAGNOSIS — Y999 Unspecified external cause status: Secondary | ICD-10-CM | POA: Diagnosis not present

## 2016-03-06 DIAGNOSIS — M7918 Myalgia, other site: Secondary | ICD-10-CM

## 2016-03-06 MED ORDER — IBUPROFEN 400 MG PO TABS
800.0000 mg | ORAL_TABLET | Freq: Once | ORAL | Status: AC
  Administered 2016-03-06: 800 mg via ORAL
  Filled 2016-03-06: qty 2

## 2016-03-06 MED ORDER — ACETAMINOPHEN 500 MG PO TABS
1000.0000 mg | ORAL_TABLET | Freq: Once | ORAL | Status: AC
Start: 2016-03-06 — End: 2016-03-06
  Administered 2016-03-06: 1000 mg via ORAL
  Filled 2016-03-06: qty 2

## 2016-03-06 NOTE — ED Notes (Signed)
Patient transported to X-ray 
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
## 2016-03-06 NOTE — Discharge Instructions (Signed)
Take 4 over the counter ibuprofen tablets 3 times a day or 2 over-the-counter naproxen tablets twice a day for pain. Also take tylenol 1000mg(2 extra strength) four times a day.    
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
## 2016-03-06 NOTE — ED Notes (Signed)
Patient returned from XR. 
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
## 2019-06-10 ENCOUNTER — Ambulatory Visit: Payer: Self-pay | Attending: Internal Medicine

## 2019-06-10 DIAGNOSIS — Z20822 Contact with and (suspected) exposure to covid-19: Secondary | ICD-10-CM | POA: Insufficient documentation

## 2019-06-11 LAB — SARS-COV-2, NAA 2 DAY TAT

## 2019-06-11 LAB — NOVEL CORONAVIRUS, NAA: SARS-CoV-2, NAA: NOT DETECTED

## 2020-05-31 ENCOUNTER — Other Ambulatory Visit: Payer: Self-pay

## 2020-05-31 ENCOUNTER — Encounter (HOSPITAL_COMMUNITY): Payer: Self-pay | Admitting: Emergency Medicine

## 2020-05-31 ENCOUNTER — Ambulatory Visit (HOSPITAL_COMMUNITY)
Admission: EM | Admit: 2020-05-31 | Discharge: 2020-05-31 | Disposition: A | Discharge status: Home / Self Care | Payer: Self-pay | Attending: Family Medicine | Admitting: Family Medicine

## 2020-05-31 DIAGNOSIS — N390 Urinary tract infection, site not specified: Secondary | ICD-10-CM

## 2020-05-31 DIAGNOSIS — R000 Tachycardia, unspecified: Secondary | ICD-10-CM

## 2020-05-31 DIAGNOSIS — R030 Elevated blood-pressure reading, without diagnosis of hypertension: Secondary | ICD-10-CM

## 2020-05-31 HISTORY — DX: Essential (primary) hypertension: I10

## 2020-05-31 LAB — POCT URINALYSIS DIPSTICK, ED / UC
Glucose, UA: NEGATIVE mg/dL
Glucose, UA: NEGATIVE mg/dL
Ketones, ur: 15 mg/dL — AB
Ketones, ur: 15 mg/dL — AB
Nitrite: NEGATIVE
Nitrite: NEGATIVE
Protein, ur: >=300 mg/dL — AB
Protein, ur: >=300 mg/dL — AB
Specific Gravity, Urine: 1.025 (ref 1.005–1.030)
Specific Gravity, Urine: 1.02 (ref 1.005–1.030)
Urobilinogen, UA: 4 mg/dL — ABNORMAL HIGH (ref 0.0–1.0)
Urobilinogen, UA: 4 mg/dL — ABNORMAL HIGH (ref 0.0–1.0)
pH: 5.5 (ref 5.0–8.0)
pH: 6 (ref 5.0–8.0)

## 2020-05-31 LAB — POCT RAPID STREP A, ED / UC: Streptococcus, Group A Screen (Direct): NEGATIVE

## 2020-05-31 LAB — POC URINE PREG, ED: Preg Test, Ur: NEGATIVE

## 2020-05-31 MED ORDER — SULFAMETHOXAZOLE-TRIMETHOPRIM 800-160 MG PO TABS: 1.0000 | ORAL_TABLET | Freq: Two times a day (BID) | ORAL | 0 refills | Status: AC

## 2020-05-31 NOTE — ED Triage Notes (Signed)
Patient has pain with urination.  Symptoms started last week.  Patient has to urinate frequently.  Patient AZO, but minimal relief.   Patient drank water and cranberry juice.  Tuesday started vomiting.  .  No vomiting today.  Patient reports lower back pain for one week.

## 2020-05-31 NOTE — ED Provider Notes (Signed)
[facility]    CSN: [id number] Arrival date & time: 05/31/20  6195      History   Chief Complaint Chief Complaint  Patient presents with  . Urinary Tract Infection    HPI Cynthia Clarke is a 55 y.o. female.   Here today with about a week of painful urination, frequency, mild generalized lower abdominal and lower back soreness, anorexia, fatigue.  Had some vomiting for 5 days ago but none since.  Does state there is an odor to her urine.  She denies fever, chills, bowel changes, dizziness, chest pain, shortness of breath, severe headache, vaginal discharge, concern for STDs, history of urinary tract infections.  The only new thing lately has been she did a douche at home about a month ago and notes things have not been right since.  So far has been trying Azo and drinking cranberry juice and water with mild relief.     Past Medical History:  Diagnosis Date  . Hypertension     There are no problems to display for this patient.   History reviewed. No pertinent surgical history.  OB History   No obstetric history on file.      Home Medications    Prior to Admission medications   Medication Sig Start Date End Date Taking? Authorizing Provider  sulfamethoxazole-trimethoprim (BACTRIM DS) 800-160 MG tablet Take 1 tablet by mouth 2 (two) times daily. 05/31/20  Yes Particia Nearing, PA-C  benzonatate (TESSALON) 200 MG capsule Take 1 capsule (200 mg total) by mouth 3 (three) times daily as needed for cough. 01/14/13   Reuben Likes, MD  fexofenadine (ALLEGRA) 180 MG tablet Take 1 tablet (180 mg total) by mouth daily. 01/14/13   Reuben Likes, MD  fluconazole (DIFLUCAN) 150 MG tablet Take 1 tablet (150 mg total) by mouth once. 03/16/14   Reuben Likes, MD  lisinopril-hydrochlorothiazide (PRINZIDE,ZESTORETIC) 20-25 MG per tablet Take 1 tablet by mouth daily. Patient not taking: Reported on 05/31/2020 03/16/14   Reuben Likes, MD   lisinopril-hydrochlorothiazide (PRINZIDE,ZESTORETIC) 20-25 MG per tablet Take 1 tablet by mouth daily. Patient not taking: Reported on 05/31/2020 03/16/14   Reuben Likes, MD  predniSONE (DELTASONE) 20 MG tablet Take 3 daily for 5 days, 2 daily for 5 days, 1 daily for 5 days. 03/16/14   Reuben Likes, MD    Family History No family history on file.  Social History Social History   Tobacco Use  . Smoking status: Never Smoker  Substance Use Topics  . Alcohol use: No  . Drug use: No     Allergies   Penicillins   Review of Systems Review of Systems Per HPI  Physical Exam Triage Vital Signs ED Triage Vitals  Enc Vitals Group     BP 05/31/20 0957 (S) (!) 207/122     Pulse Rate 05/31/20 0957 (!) 120     Resp 05/31/20 0957 20     Temp 05/31/20 0957 99.6 F (37.6 C)     Temp Source 05/31/20 0957 Oral     SpO2 05/31/20 0957 97 %     Weight --      Height --      Head Circumference --      Peak Flow --      Pain Score 05/31/20 0953 8     Pain Loc --      Pain Edu? --      Excl. in GC? --    No data  found.  Updated Vital Signs BP (S) (!) 207/122 (BP Location: Left Arm) Comment: crying  Pulse (!) 120   Temp 99.6 F (37.6 C) (Oral)   Resp 20 Comment: crying  SpO2 97%   Visual Acuity Right Eye Distance:   Left Eye Distance:   Bilateral Distance:    Right Eye Near:   Left Eye Near:    Bilateral Near:     Physical Exam Vitals and nursing note reviewed.  Constitutional:      Appearance: Normal appearance. She is not ill-appearing.  HENT:     Head: Atraumatic.     Mouth/Throat:     Mouth: Mucous membranes are moist.  Eyes:     Extraocular Movements: Extraocular movements intact.     Conjunctiva/sclera: Conjunctivae normal.  Cardiovascular:     Rate and Rhythm: Normal rate and regular rhythm.     Heart sounds: Normal heart sounds.  Pulmonary:     Effort: Pulmonary effort is normal. No respiratory distress.     Breath sounds: Normal breath sounds. No  wheezing or rales.  Abdominal:     General: Bowel sounds are normal. There is no distension.     Palpations: Abdomen is soft.     Tenderness: There is no abdominal tenderness. There is no right CVA tenderness, left CVA tenderness or guarding.  Genitourinary:    Comments: GU exam deferred, self swab performed Musculoskeletal:        General: Normal range of motion.     Cervical back: Normal range of motion and neck supple.  Skin:    General: Skin is warm and dry.     Findings: No rash.  Neurological:     Mental Status: She is alert and oriented to person, place, and time.     Cranial Nerves: No cranial nerve deficit.     Motor: No weakness.     Gait: Gait normal.  Psychiatric:        Thought Content: Thought content normal.        Judgment: Judgment normal.     Comments: Tearful during visit    UC Treatments / Results  Labs (all labs ordered are listed, but only abnormal results are displayed) Labs Reviewed  POCT URINALYSIS DIPSTICK, ED / UC - Abnormal; Notable for the following components:      Result Value   Bilirubin Urine SMALL (*)    Ketones, ur 15 (*)    Hgb urine dipstick LARGE (*)    Protein, ur >=300 (*)    Urobilinogen, UA 4.0 (*)    Leukocytes,Ua LARGE (*)    All other components within normal limits  POCT URINALYSIS DIPSTICK, ED / UC - Abnormal; Notable for the following components:   Bilirubin Urine SMALL (*)    Ketones, ur 15 (*)    Hgb urine dipstick LARGE (*)    Protein, ur >=300 (*)    Urobilinogen, UA 4.0 (*)    Leukocytes,Ua LARGE (*)    All other components within normal limits  URINE CULTURE  POCT RAPID STREP A, ED / UC  POC URINE PREG, ED  CERVICOVAGINAL ANCILLARY ONLY    EKG   Radiology No results found.  Procedures Procedures (including critical care time)  Medications Ordered in UC Medications - No data to display  Initial Impression / Assessment and Plan / UC Course  I have reviewed the triage vital signs and the nursing  notes.  Pertinent labs & imaging results that were available during my care of the  patient were reviewed by me and considered in my medical decision making (see chart for details).     She is significantly hypertensive and tachycardic in triage, but denies any dizziness, chest pain, shortness of breath, palpitations at this time.  Of note she is very anxious and tearful today which she attributes to stress.  Discussed with her that her EKG shows a fast heart rate but otherwise no significant emergent findings, however given her abnormal vital signs she may benefit from further observation and evaluation in the emergency department.  She declines today and plans to get a primary care provider soon as possible to recheck these things.  She is aware to go to the hospital if her symptoms worsen at any point.  Regarding her urinary symptoms, UA showing dehydration and possibly some evidence of urinary tract infection though nitrite negative.  Urine culture and vaginal swab pending, am suspicious for bacterial vaginosis given the recent history of douching and vaginal odor.  Will adjust treatment based on results in the next few days, but will start Bactrim in the meantime in case urinary tract infection causing her ongoing symptoms.  Push fluids, over-the-counter pain relievers, return for acutely worsening symptoms.  Final Clinical Impressions(s) / UC Diagnoses   Final diagnoses:  Lower urinary tract infectious disease  Single episode of elevated blood pressure  Tachycardia   Discharge Instructions   None    ED Prescriptions    Medication Sig Dispense Auth. Provider   sulfamethoxazole-trimethoprim (BACTRIM DS) 800-160 MG tablet Take 1 tablet by mouth 2 (two) times daily. 6 tablet Particia Nearing, [place]     PDMP not reviewed this encounter.   Particia Nearing, [place] 05/31/20 1053

## 2020-05-31 NOTE — ED Notes (Signed)
Urine labeled and in lab

## 2020-05-31 NOTE — ED Notes (Signed)
Patient is resting comfortably. 

## 2020-06-01 ENCOUNTER — Telehealth (HOSPITAL_COMMUNITY): Payer: Self-pay | Admitting: Emergency Medicine

## 2020-06-01 LAB — CERVICOVAGINAL ANCILLARY ONLY
Bacterial Vaginitis (gardnerella): POSITIVE — AB
Candida Glabrata: NEGATIVE
Candida Vaginitis: NEGATIVE
Comment: NEGATIVE
Comment: NEGATIVE
Comment: NEGATIVE

## 2020-06-01 LAB — URINE CULTURE: Culture: >=100000 — AB

## 2020-06-01 MED ORDER — METRONIDAZOLE 500 MG PO TABS: 500.0000 mg | ORAL_TABLET | Freq: Two times a day (BID) | ORAL | 0 refills | Status: AC

## 2020-06-02 LAB — URINE CULTURE

## 2020-07-12 ENCOUNTER — Inpatient Hospital Stay: Payer: Self-pay | Admitting: Physician Assistant

## 2020-07-12 NOTE — Progress Notes (Deleted)
Patient ID: Cynthia Clarke, female   DOB: 1965/08/26, 55 y.o.   MRN: [id number]    After UC visit 05/31/2020.  From A/P:  She is significantly hypertensive and tachycardic in triage, but denies any dizziness, chest pain, shortness of breath, palpitations at this time.  Of note she is very anxious and tearful today which she attributes to stress.  Discussed with her that her EKG shows a fast heart rate but otherwise no significant emergent findings, however given her abnormal vital signs she may benefit from further observation and evaluation in the emergency department.  She declines today and plans to get a primary care provider soon as possible to recheck these things.  She is aware to go to the hospital if her symptoms worsen at any point.  Regarding her urinary symptoms, UA showing dehydration and possibly some evidence of urinary tract infection though nitrite negative.  Urine culture and vaginal swab pending, am suspicious for bacterial vaginosis given the recent history of douching and vaginal odor.  Will adjust treatment based on results in the next few days, but will start Bactrim in the meantime in case urinary tract infection causing her ongoing symptoms.  Push fluids, over-the-counter pain relievers, return for acutely worsening symptoms

## 2022-08-18 ENCOUNTER — Emergency Department (HOSPITAL_COMMUNITY): Payer: 59

## 2022-08-18 ENCOUNTER — Encounter (HOSPITAL_COMMUNITY): Payer: Self-pay

## 2022-08-18 ENCOUNTER — Emergency Department (HOSPITAL_COMMUNITY)
Admission: EM | Admit: 2022-08-18 | Discharge: 2022-08-18 | Disposition: A | Discharge status: Home / Self Care | Payer: 59 | Attending: Emergency Medicine | Admitting: Emergency Medicine

## 2022-08-18 DIAGNOSIS — M5459 Other low back pain: Secondary | ICD-10-CM | POA: Diagnosis not present

## 2022-08-18 DIAGNOSIS — W010XXA Fall on same level from slipping, tripping and stumbling without subsequent striking against object, initial encounter: Secondary | ICD-10-CM | POA: Diagnosis not present

## 2022-08-18 DIAGNOSIS — I10 Essential (primary) hypertension: Secondary | ICD-10-CM | POA: Diagnosis not present

## 2022-08-18 DIAGNOSIS — R102 Pelvic and perineal pain: Secondary | ICD-10-CM | POA: Diagnosis not present

## 2022-08-18 DIAGNOSIS — Z043 Encounter for examination and observation following other accident: Secondary | ICD-10-CM | POA: Diagnosis not present

## 2022-08-18 DIAGNOSIS — W19XXXA Unspecified fall, initial encounter: Secondary | ICD-10-CM

## 2022-08-18 DIAGNOSIS — M25551 Pain in right hip: Secondary | ICD-10-CM | POA: Insufficient documentation

## 2022-08-18 DIAGNOSIS — M545 Low back pain, unspecified: Secondary | ICD-10-CM | POA: Insufficient documentation

## 2022-08-18 MED ORDER — CLONIDINE HCL 0.1 MG PO TABS
0.2000 mg | ORAL_TABLET | Freq: Once | ORAL | Status: AC
  Administered 2022-08-18: 0.2 mg via ORAL
  Filled 2022-08-18: qty 2

## 2022-08-18 MED ORDER — CYCLOBENZAPRINE HCL 10 MG PO TABS: 10.0000 mg | ORAL_TABLET | Freq: Two times a day (BID) | ORAL | 0 refills | Status: AC | PRN

## 2022-08-18 MED ORDER — AMLODIPINE BESYLATE 10 MG PO TABS: 10.0000 mg | ORAL_TABLET | Freq: Every day | ORAL | 1 refills | Status: AC

## 2022-08-18 MED ORDER — ACETAMINOPHEN 325 MG PO TABS: 650.0000 mg | ORAL_TABLET | Freq: Four times a day (QID) | ORAL | 0 refills | Status: AC | PRN

## 2022-08-18 MED ORDER — OXYCODONE HCL 5 MG PO TABS
10.0000 mg | ORAL_TABLET | Freq: Once | ORAL | Status: AC
  Administered 2022-08-18: 10 mg via ORAL
  Filled 2022-08-18: qty 2

## 2022-08-18 MED ORDER — IBUPROFEN 800 MG PO TABS
800.0000 mg | ORAL_TABLET | Freq: Once | ORAL | Status: AC
  Administered 2022-08-18: 800 mg via ORAL
  Filled 2022-08-18: qty 1

## 2022-08-18 MED ORDER — IBUPROFEN 800 MG PO TABS: 800.0000 mg | ORAL_TABLET | Freq: Three times a day (TID) | ORAL | 0 refills | Status: AC | PRN
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
## 2022-08-18 NOTE — Discharge Instructions (Addendum)
Please call to establish care with a primary care provider in the area.  Your chronic medical conditions, including your high blood pressure, should be monitored by doctor's office.

## 2022-08-18 NOTE — ED Provider Notes (Signed)
[place] EMERGENCY DEPARTMENT AT [facility] Provider Note   CSN: [id number] Arrival date & time: 08/18/22  1938     History  Chief Complaint  Patient presents with   Cynthia Clarke is a 57 y.o. female presented to ED with a fall.  Patient reports that she slipped on wet cement or pavement in her slippers today, landed on her right side on the pavement.  She was having significant pain primarily in her right hip, right lower back, worse with any type of movement.  She denies head injury or loss of consciousness.  She reports she does not have a PCP.  She does not check her blood pressure regularly and was told in the past has high blood pressure but does not take medications for it.  Her pain is 10 out of 10.  HPI     Home Medications Prior to Admission medications   Medication Sig Start Date End Date Taking? Authorizing Provider  acetaminophen (TYLENOL) 325 MG tablet Take 2 tablets (650 mg total) by mouth every 6 (six) hours as needed for up to 30 doses for moderate pain or mild pain. 08/18/22  Yes Terald Sleeper, MD  amLODipine (NORVASC) 10 MG tablet Take 1 tablet (10 mg total) by mouth daily. 08/18/22 09/17/22 Yes Dannon Nguyenthi, Kermit Balo, MD  cyclobenzaprine (FLEXERIL) 10 MG tablet Take 1 tablet (10 mg total) by mouth 2 (two) times daily as needed for up to 14 doses for muscle spasms. 08/18/22  Yes Terald Sleeper, MD  ibuprofen (ADVIL) 800 MG tablet Take 1 tablet (800 mg total) by mouth every 8 (eight) hours as needed for up to 30 doses. 08/18/22  Yes Rupinder Livingston, Kermit Balo, MD  metroNIDAZOLE (FLAGYL) 500 MG tablet Take 1 tablet (500 mg total) by mouth 2 (two) times daily. Patient not taking: Reported on 08/18/2022 06/01/20   Merrilee Jansky, MD  Multiple Vitamins-Minerals (MULTIVITAMIN WITH MINERALS) tablet Take 1 tablet by mouth daily.   Yes [provider]  benzonatate (TESSALON) 200 MG capsule Take 1 capsule (200 mg total) by mouth 3 (three) times daily as needed  for cough. Patient not taking: Reported on 08/18/2022 01/14/13   Reuben Likes, MD  fexofenadine (ALLEGRA) 180 MG tablet Take 1 tablet (180 mg total) by mouth daily. Patient not taking: Reported on 08/18/2022 01/14/13   Reuben Likes, MD  fluconazole (DIFLUCAN) 150 MG tablet Take 1 tablet (150 mg total) by mouth once. Patient not taking: Reported on 08/18/2022 03/16/14   Reuben Likes, MD  lisinopril-hydrochlorothiazide (PRINZIDE,ZESTORETIC) 20-25 MG per tablet Take 1 tablet by mouth daily. Patient not taking: Reported on 05/31/2020 03/16/14   Reuben Likes, MD  lisinopril-hydrochlorothiazide (PRINZIDE,ZESTORETIC) 20-25 MG per tablet Take 1 tablet by mouth daily. Patient not taking: Reported on 05/31/2020 03/16/14   Reuben Likes, MD  predniSONE (DELTASONE) 20 MG tablet Take 3 daily for 5 days, 2 daily for 5 days, 1 daily for 5 days. Patient not taking: Reported on 08/18/2022 03/16/14   Reuben Likes, MD  sulfamethoxazole-trimethoprim (BACTRIM DS) 800-160 MG tablet Take 1 tablet by mouth 2 (two) times daily. Patient not taking: Reported on 08/18/2022 05/31/20   Particia Nearing, PA-C      Allergies    Penicillins    Review of Systems   Review of Systems  Physical Exam Updated Vital Signs BP (!) 140/83   Pulse 62   Temp (!) 97.5 F (36.4 C) (Oral)  Resp 16   SpO2 98%  Physical Exam Constitutional:      General: She is not in acute distress. HENT:     Head: Normocephalic and atraumatic.  Eyes:     Conjunctiva/sclera: Conjunctivae normal.     Pupils: Pupils are equal, round, and reactive to light.  Cardiovascular:     Rate and Rhythm: Normal rate and regular rhythm.  Pulmonary:     Effort: Pulmonary effort is normal. No respiratory distress.  Musculoskeletal:     Comments: Difficult, diffuse paraspinal muscle tenderness, lumbar midline tenderness, no thoracic or cervical midline tenderness No pelvic instability.  Patient does have pain with flexion at the right hip No  visible deformity or tenderness of the further extremities  Skin:    General: Skin is warm and dry.  Neurological:     General: No focal deficit present.     Mental Status: She is alert and oriented to person, place, and time. Mental status is at baseline.  Psychiatric:        Mood and Affect: Mood normal.        Behavior: Behavior normal.     ED Results / Procedures / Treatments   Labs (all labs ordered are listed, but only abnormal results are displayed) Labs Reviewed - No data to display  EKG None  Radiology DG Pelvis Portable  Result Date: 08/18/2022 CLINICAL DATA:  Right hip pain after fall EXAM: PORTABLE PELVIS 1-2 VIEWS; RIGHT FEMUR 2 VIEWS COMPARISON:  None Available. FINDINGS: There is no evidence of pelvic fracture or diastasis. Mild degenerative arthritis both hips. No pelvic bone lesions are seen. IUD. IMPRESSION: No acute fracture or dislocation. Electronically Signed   By: Minerva Fester M.D.   On: 08/18/2022 21:46   DG Femur Min 2 Views Right  Result Date: 08/18/2022 CLINICAL DATA:  Right hip pain after fall EXAM: PORTABLE PELVIS 1-2 VIEWS; RIGHT FEMUR 2 VIEWS COMPARISON:  None Available. FINDINGS: There is no evidence of pelvic fracture or diastasis. Mild degenerative arthritis both hips. No pelvic bone lesions are seen. IUD. IMPRESSION: No acute fracture or dislocation. Electronically Signed   By: Minerva Fester M.D.   On: 08/18/2022 21:46   CT Lumbar Spine Wo Contrast  Result Date: 08/18/2022 CLINICAL DATA:  Fall EXAM: CT LUMBAR SPINE WITHOUT CONTRAST TECHNIQUE: Multidetector CT imaging of the lumbar spine was performed without intravenous contrast administration. Multiplanar CT image reconstructions were also generated. RADIATION DOSE REDUCTION: This exam was performed according to the departmental dose-optimization program which includes automated exposure control, adjustment of the mA and/or kV according to patient size and/or use of iterative reconstruction  technique. COMPARISON:  None Available. FINDINGS: Segmentation: 5 lumbar type vertebrae. Alignment: Normal. Vertebrae: No acute fracture or focal pathologic process. Paraspinal and other soft tissues: Negative. Disc levels: No spinal canal stenosis. Moderate facet arthrosis at L4-5. IMPRESSION: No acute fracture or static subluxation of the lumbar spine. Electronically Signed   By: Deatra Robinson M.D.   On: 08/18/2022 21:38    Procedures Procedures    Medications Ordered in ED Medications  cloNIDine (CATAPRES) tablet 0.2 mg (0.2 mg Oral Given 08/18/22 2058)  ibuprofen (ADVIL) tablet 800 mg (800 mg Oral Given 08/18/22 2058)  oxyCODONE (Oxy IR/ROXICODONE) immediate release tablet 10 mg (10 mg Oral Given 08/18/22 2058)    ED Course/ Medical Decision Making/ A&P Clinical Course as of 08/18/22 2338  Mon Aug 18, 2022  2247 BP 163/90 at this time - still elevated but improving.  Patient's pain  appears improved as well.  Her son is now present to take her home.  I suspect this likely related to muscle spasm pain, or contusion.  Will prescribe her Flexeril, ibuprofen, discussed heating packs and muscle rubs.  Will also start her on amlodipine and she understands she needs to follow-up for high blood pressure [MT]    Clinical Course User Index [MT] Royal Vandevoort, Kermit Balo, MD                             Medical Decision Making Amount and/or Complexity of Data Reviewed Radiology: ordered.  Risk OTC drugs. Prescription drug management.   Patient is here with a mechanical fall today, complaining predominantly of right-sided pain, most focally in her right hip and lower back.  X-rays and CT scans were ordered and personally reviewed and interpreted, showing no emergent finding or fracture  Oral pain medications were given improvement of pain.  We also discussed her likely untreated hypertension.  Pain of course can elevate blood pressure, but I suspect that with her degree of elevation she probably has  chronic untreated hypertension.  We will start on her blood pressure medicine but I made quite clear to her that she needs to search for primary care clinic, and will need to monitor her blood pressure moving forward.  She verbalized understanding.  No indication for neuroimaging or further spine imaging at this time.  There is no emergent indication for blood testing at this time.  No signs of hypertensive emergency.        Final Clinical Impression(s) / ED Diagnoses Final diagnoses:  Fall, initial encounter  Acute right-sided low back pain without sciatica  Hypertension, unspecified type    Rx / DC Orders ED Discharge Orders          Ordered    cyclobenzaprine (FLEXERIL) 10 MG tablet  2 times daily PRN        08/18/22 2249    ibuprofen (ADVIL) 800 MG tablet  Every 8 hours PRN        08/18/22 2249    amLODipine (NORVASC) 10 MG tablet  Daily        08/18/22 2249    acetaminophen (TYLENOL) 325 MG tablet  Every 6 hours PRN        08/18/22 2249              Terald Sleeper, MD 08/18/22 2338

## 2022-08-26 ENCOUNTER — Telehealth: Payer: Self-pay | Admitting: *Deleted

## 2022-08-26 NOTE — Progress Notes (Signed)
  Care Coordination   Note   08/26/2022 Name: REJEANA FADNESS MRN: [id number] DOB: Jul 07, 1965  JAYDY FITZHENRY is a 56 y.o. year old female who sees Patient, No Pcp Per for primary care. I reached out to [name] by phone today to offer care coordination services.  Ms. Somoza was given information about Care Coordination services today including:   The Care Coordination services include support from the care team which includes your Nurse Coordinator, Clinical Social Worker, or Pharmacist.  The Care Coordination team is here to help remove barriers to the health concerns and goals most important to you. Care Coordination services are voluntary, and the patient may decline or stop services at any time by request to their care team member.   Care Coordination Consent Status: Patient agreed to services and verbal consent obtained.   Follow up plan:  Telephone appointment with care coordination team member scheduled for:  08/27/22  Encounter Outcome:  Pt. Scheduled  [facility] Coordination Care Guide  Direct Dial: [phone]

## 2022-08-27 ENCOUNTER — Ambulatory Visit: Payer: Self-pay

## 2022-08-27 NOTE — Patient Instructions (Signed)
Visit Information  Thank you for taking time to visit with me today. Please don't hesitate to contact me if I can be of assistance to you.   Following are the goals we discussed today:  - Attend new patient appointment on 6/13 at 3:00 pm with Dr. Jeanie Sewer at [facility] [address] [place] [phone]   Our next appointment is by telephone on 6/19 at 12:30  Please call the care guide team at [phone] if you need to cancel or reschedule your appointment.   If you are experiencing a Mental Health or Behavioral Health Crisis or need someone to talk to, please go to [facility] [address], [place] [phone]) call 911  Patient verbalizes understanding of instructions and care plan provided today and agrees to view in MyChart. Active MyChart status and patient understanding of how to access instructions and care plan via MyChart confirmed with patient.     Bevelyn Ngo, BSW, CDP Social Worker, Certified Dementia Practitioner [facility] Care Management  Care Coordination [phone]

## 2022-08-27 NOTE — Patient Outreach (Signed)
  Care Coordination   Initial Visit Note   08/27/2022 Name: LYNCOLN MASKELL MRN: [id number] DOB: [birth year]  KEENAN DIMITROV is a 57 y.o. year old female who sees Patient, No Pcp Per for primary care. I spoke with  [name] by phone today.  What matters to the patients health and wellness today?  The patient would like to establish care with a primary care provider.    Goals Addressed             This Visit's Progress    Care Coordination Activities       Care Coordination Interventions: Contacted the patient to assist with establishing care with a primary care provider Determined the patient would like to schedule an appointment with [facility] as they are close to her home Assisted the patient with called [facility] [employer]; appointment was scheduled for 6/13 at 3:00 pm with Dr. Jeanie Sewer Confirmed the patient does not have transportation barriers and is able to get to this appointment as scheduled Provided the patient with the contact number and address to [facility] at [address] Discussed plans for SW to follow up with the patient over the next 14 days        SDOH assessments and interventions completed:  No     Care Coordination Interventions:  Yes, provided   Interventions Today    Flowsheet Row Most Recent Value  Chronic Disease   Chronic disease during today's visit Other  General Interventions   General Interventions Discussed/Reviewed General Interventions Discussed, Doctor Visits  Doctor Visits Discussed/Reviewed PCP  [Assisted with scheduling a new patient appointment for primary care follow up]        Follow up plan: Follow up call scheduled for 6/19    Encounter Outcome:  Pt. Visit Completed   Bevelyn Ngo, Kenard Gower, CDP Social Worker, Certified Dementia Practitioner [facility] Care Management  Care Coordination [phone]

## 2022-08-28 DIAGNOSIS — S300XXD Contusion of lower back and pelvis, subsequent encounter: Secondary | ICD-10-CM | POA: Diagnosis not present

## 2022-08-28 DIAGNOSIS — Z1211 Encounter for screening for malignant neoplasm of colon: Secondary | ICD-10-CM | POA: Diagnosis not present

## 2022-08-28 DIAGNOSIS — S0993XD Unspecified injury of face, subsequent encounter: Secondary | ICD-10-CM | POA: Diagnosis not present

## 2022-08-28 DIAGNOSIS — I10 Essential (primary) hypertension: Secondary | ICD-10-CM | POA: Diagnosis not present

## 2022-09-03 ENCOUNTER — Ambulatory Visit: Payer: Self-pay

## 2022-09-03 DIAGNOSIS — S300XXD Contusion of lower back and pelvis, subsequent encounter: Secondary | ICD-10-CM | POA: Diagnosis not present

## 2022-09-03 NOTE — Patient Instructions (Signed)
Visit Information  Thank you for taking time to visit with me today. Please don't hesitate to contact me if I can be of assistance to you.   Following are the goals we discussed today:  - Engage with Physical Therapy as ordered by your primary care provider -Attend follow up with your PCP on 6/20 -Engage with RN Care Manager for ongoing care coordination services   Your next appointment is by telephone on 7/16 at 1:00 with RN Care Manager Fleeta Emmer  Please call the care guide team at [phone] if you need to cancel or reschedule your appointment.   If you are experiencing a Mental Health or Behavioral Health Crisis or need someone to talk to, please call 1-800-273-TALK (toll free, 24 hour hotline) go to [facility] [address] [place] [phone]) call 911  Patient verbalizes understanding of instructions and care plan provided today and agrees to view in MyChart. Active MyChart status and patient understanding of how to access instructions and care plan via MyChart confirmed with patient.     No further follow up required: Please contact me as needed.  Bevelyn Ngo, BSW, CDP Social Worker, Certified Dementia Practitioner [facility] Care Management  Care Coordination [phone]

## 2022-09-03 NOTE — Patient Outreach (Signed)
  Care Coordination   Follow Up Visit Note   09/03/2022 Name: Cynthia Clarke MRN: [id number] DOB: [birth year]  Cynthia Clarke is a 57 y.o. year old female who sees Red[place], Valrie Hart, MD for primary care. I spoke with  Cynthia Clarke by phone today.  What matters to the patients health and wellness today?  The patient would like to feel better so she may return to work.    Goals Addressed             This Visit's Progress    COMPLETED: Care Coordination Activities       Care Coordination Interventions: Discussed patients recently established care with her new primary care provider at [facility] on [address] Reviewed the patient was prescribed medication for hypertension and back pain. Patient is taking the new medication for hypertension but has not yet taken pain medication. She reports she continues to take Tylenol for pain Determined the patient has been referred to physical therapy and will attend an initial appointment today at 3pm at [facility] Discussed the patients provider has taken her out of work for at least 2 more weeks. She is concerned about resource needs as she does not have any income Reviewed resource options such as food stamps, Medicaid, Work First, and Unemployment - patient is aware she may apply for these benefits but is opting not to at this time. She understands how to apply if desired Patient plans to follow up with her primary care provider on 6/20 for labwork and to discuss plans for future health maintenance items including a Mammogram, PAP Smear, and Colonoscopy Discussed the patient is nervous about completing these health maintenance items but understands it is important to follow up on Scheduled the patient to speak with RN Care Manager Fleeta Emmer on July 16th for ongoing care coordination services Collaboration with RN Care Manager regarding interventions and plan        SDOH assessments and interventions completed:  Yes  SDOH  Interventions Today    Flowsheet Row Most Recent Value  SDOH Interventions   Food Insecurity Interventions Other (Comment)  [Pt reports she utilizes food pantries as needed. Out of work without income due to injury. Does not want to apply for FNS at this time]  Housing Interventions Intervention Not Indicated  Transportation Interventions Intervention Not Indicated        Care Coordination Interventions:  Yes, provided   Interventions Today    Flowsheet Row Most Recent Value  Chronic Disease   Chronic disease during today's visit Hypertension (HTN), Other  [back pain]  General Interventions   General Interventions Discussed/Reviewed General Interventions Reviewed, Doctor Visits  Doctor Visits Discussed/Reviewed Doctor Visits Discussed  Education Interventions   Education Provided Provided Education  Provided Verbal Education On Community Resources        Follow up plan: Follow up call scheduled for 7/16 with RN Care Manager    Encounter Outcome:  Pt. Visit Completed   Bevelyn Ngo, Kenard Gower, CDP Social Worker, Certified Dementia Practitioner [facility] Care Management  Care Coordination [phone]

## 2022-09-04 DIAGNOSIS — I10 Essential (primary) hypertension: Secondary | ICD-10-CM | POA: Diagnosis not present

## 2022-09-04 DIAGNOSIS — S39012D Strain of muscle, fascia and tendon of lower back, subsequent encounter: Secondary | ICD-10-CM | POA: Diagnosis not present

## 2022-09-05 ENCOUNTER — Ambulatory Visit: Payer: Self-pay

## 2022-09-05 NOTE — Patient Instructions (Signed)
Visit Information  Thank you for taking time to visit with me today. Please don't hesitate to contact me if I can be of assistance to you.   Following are the goals we discussed today:  - Contact your medical team regarding questions about participating in PT prior to your visit with the spine specialist - Contact your primary care providers office as needed   Our next appointment is by telephone on 7/5 at 12:30 pm  Please call the care guide team at [phone] if you need to cancel or reschedule your appointment.   If you are experiencing a Mental Health or Behavioral Health Crisis or need someone to talk to, please go to [facility] [address] [place] [phone]) call 911  Patient verbalizes understanding of instructions and care plan provided today and agrees to view in MyChart. Active MyChart status and patient understanding of how to access instructions and care plan via MyChart confirmed with patient.     Bevelyn Ngo, BSW, CDP Social Worker, Certified Dementia Practitioner [facility] Care Management  Care Coordination [phone]

## 2022-09-05 NOTE — Patient Outreach (Signed)
  Care Coordination   Follow Up Visit Note   09/05/2022 Name: Cynthia Clarke MRN: [id number] DOB: 1965/09/05  Cynthia Clarke is a 57 y.o. year old female who sees Redding, Valrie Hart, MD for primary care. I spoke with  Cynthia Clarke by phone today.  What matters to the patients health and wellness today?  Recovering from a fall to feel better and return to work    Goals Addressed             This Visit's Progress    Care Coordination Activities       Care Coordination Interventions: Inbound call received from the patient who reports she attended her primary care provider follow up and was referred to a spine specialist. Patient is concerned with the possibility she may need surgery and how that will impact her financially since she is out of work from her injury Discussed the patient has put a call into her lawyer regarding this concern; encouraged the patient to stay in close contact with her lawyer with her concerns Determined the patient is scheduled to attend physical therapy Mondays and Wednesdays - she is concerned with the impact physical therapy may have prior to seeing the spine specialist Advised the patient to contact her health care team to inquire if she should remain active with PT prior to spine specialist appointment Discussed plan for SW to follow up with the patient over the next two weeks; patient is encouraged to call SW as needed Collaboration with RN Care Manager Dionne Leath to advise of updates from the patient         SDOH assessments and interventions completed:  No     Care Coordination Interventions:  Yes, provided   Interventions Today    Flowsheet Row Most Recent Value  Chronic Disease   Chronic disease during today's visit Hypertension (HTN), Other  [back pain]  General Interventions   General Interventions Discussed/Reviewed General Interventions Reviewed, Doctor Visits, Communication with  Doctor Visits Discussed/Reviewed Doctor Visits  Discussed  Communication with RN        Follow up plan:  SW will continue to follow    Encounter Outcome:  Pt. Visit Completed   Bevelyn Ngo, Kenard Gower, CDP Social Worker, Certified Dementia Practitioner [facility] Care Management  Care Coordination [phone]

## 2022-09-19 ENCOUNTER — Telehealth: Payer: Self-pay

## 2022-09-19 NOTE — Patient Outreach (Signed)
  Care Coordination   09/19/2022 Name: Cynthia Clarke MRN: [id number] DOB: 01/25/1966   Care Coordination Outreach Attempts:  An unsuccessful telephone outreach was attempted for a scheduled appointment today.  Follow Up Plan:  Additional outreach attempts will be made to offer the patient care coordination information and services.   Encounter Outcome:  No Answer   Care Coordination Interventions:  No, not indicated    Bevelyn Ngo, BSW, CDP Social Worker, Certified Dementia Practitioner [facility] Care Management  Care Coordination [phone]

## 2022-09-24 ENCOUNTER — Ambulatory Visit: Payer: Self-pay

## 2022-09-24 NOTE — Patient Outreach (Signed)
  Care Coordination   Follow Up Visit Note   09/24/2022 Name: Cynthia Clarke MRN: [id number] DOB: May 06, 1965  Cynthia Clarke is a 57 y.o. year old female who sees Redding, Valrie Hart, MD for primary care. I spoke with  Cynthia Clarke by phone today.  What matters to the patients health and wellness today?  To alleviate back pain and obtain settlement to afford cost of living    Goals Addressed             This Visit's Progress    Care Coordination Activities       Care Coordination Interventions: Discussed the patient continues to have severe low back pain which is causing her to lay down frequently. The patient has a visit scheduled with the spine specialist on 7/18 Determined the patient had to stop physical therapy due to an inability to afford her copay amount of $80 per visit Patient continues to be out of work. She reports she has 10 days to pay her rent or she will have to move out Discussed plan for the patient to contact her lawyers office to determine if there is a way to speed up a settlement due to concerns with financial hardship while out of work Education provided on the opportunity to apply for Medicaid considering the patient has yet to return to work and has no income - patient is agreeable Referral placed to DSS Eligibility Caseworker who will contact the patient to assist with completion of an application Education provided on the difference in Traditional Medicaid and Managed Medicaid outlining benefits of each        SDOH assessments and interventions completed:  No     Care Coordination Interventions:  Yes, provided   Interventions Today    Flowsheet Row Most Recent Value  Chronic Disease   Chronic disease during today's visit Hypertension (HTN), Other  [back pain]  General Interventions   General Interventions Discussed/Reviewed General Interventions Reviewed, Tax adviser to DSS Medicaid Eligibility Caseworker]        Follow  up plan: Follow up call scheduled for 7/25    Encounter Outcome:  Pt. Visit Completed   Bevelyn Ngo, Kenard Gower, CDP Social Worker, Certified Dementia Practitioner [facility] Care Management  Care Coordination [phone]

## 2022-09-24 NOTE — Patient Instructions (Signed)
Visit Information  Thank you for taking time to visit with me today. Please don't hesitate to contact me if I can be of assistance to you.   Following are the goals we discussed today:  - Engage with DSS Eligibility Case Worker to apply for Medicaid by phone - they will call you - Contact your lawyer regarding a settlement advancement   Our next appointment is by telephone on 7/25 at 12:30  Please call the care guide team at [phone] if you need to cancel or reschedule your appointment.   If you are experiencing a Mental Health or Behavioral Health Crisis or need someone to talk to, please call 1-800-273-TALK (toll free, 24 hour hotline) call 911  Patient verbalizes understanding of instructions and care plan provided today and agrees to view in MyChart. Active MyChart status and patient understanding of how to access instructions and care plan via MyChart confirmed with patient.     Bevelyn Ngo, BSW, CDP Social Worker, Certified Dementia Practitioner [facility] Care Management  Care Coordination [phone]

## 2022-09-30 ENCOUNTER — Ambulatory Visit: Payer: Self-pay

## 2022-09-30 NOTE — Patient Instructions (Signed)
Visit Information  Thank you for taking time to visit with me today. Please don't hesitate to contact me if I can be of assistance to you.   Following are the goals we discussed today:   Goals Addressed             This Visit's Progress    Back injury due to fall and HTN       Care Coordination Interventions: Evaluation of current treatment plan related to recent fall and HTN and patient's adherence to plan as established by provider Advised patient to apply for medicaid  Active listening / Reflection utilized  Emotional Support Provided  Patient with history of fall at [employer] in [place].  She states she has bad back pain and cannot stand long or sit long. She has an appointment with spine specialist on Thursday. Patient states that she is going to  have to go back to work doing something as she does not want to get put out.  She is utilizing food banks for food.  She cannot go to therapy due to co pays.  Discussed Applying for medicaid. Patient to call  this CM back for in person info to apply for medicaid.  She has spoken with her lawyer and they are still waiting for [employer] to contact them.            Our next appointment is by telephone on  10/22/22 at 100 pm  Please call the care guide team at [phone] if you need to cancel or reschedule your appointment.   If you are experiencing a Mental Health or Behavioral Health Crisis or need someone to talk to, please call the Suicide and Crisis Lifeline: 988   Patient verbalizes understanding of instructions and care plan provided today and agrees to view in MyChart. Active MyChart status and patient understanding of how to access instructions and care plan via MyChart confirmed with patient.     The patient has been provided with contact information for the care management team and has been advised to call with any health related questions or concerns.   Bary Leriche, RN, MSN [facility] Care Management Care Management  Coordinator Direct Line [phone]

## 2022-10-09 ENCOUNTER — Ambulatory Visit: Payer: Self-pay

## 2022-10-09 NOTE — Patient Outreach (Signed)
  Care Coordination   Follow Up Visit Note   10/09/2022 Name: Cynthia Clarke MRN: [id number] DOB: 29-Nov-1965  Cynthia Clarke is a 57 y.o. year old female who sees Redding, Valrie Hart, MD for primary care. I spoke with  Cynthia Clarke by phone today.  What matters to the patients health and wellness today?  Patient is searching for a new job    Goals Addressed             This Visit's Progress    Care Coordination Activities       Care Coordination Interventions: Discussed the patient has not yet applied for Medicaid - was unable to locate office to apply. Patient will follow up on this when she can Determined the patient continues to work with her lawyer on settlement with [employer] Patient reports she canceled her initial appointment with the Spine Specialist last week due to conflicts with a new job. This appointment has been rescheduled to next month  Reviewed patient continues to be behind on rent and has concerns she may face eviction soon. Patient continues to look for a job and has an interview Monday  Encouraged the patient to stay in contact with her medical team as needed Collaboration with RN Care Manager Fleeta Emmer to provide an update on goal progression        SDOH assessments and interventions completed:  No     Care Coordination Interventions:  Yes, provided   Interventions Today    Flowsheet Row Most Recent Value  Chronic Disease   Chronic disease during today's visit Hypertension (HTN), Other  [back injury]  General Interventions   General Interventions Discussed/Reviewed General Interventions Reviewed, Communication with  Communication with RN        Follow up plan:  SW will continue to follow    Encounter Outcome:  Pt. Visit Completed   Bevelyn Ngo, Kenard Gower, CDP Social Worker, Certified Dementia Practitioner [facility] Care Management  Care Coordination [phone]

## 2022-10-09 NOTE — Patient Instructions (Signed)
Visit Information  Thank you for taking time to visit with me today. Please don't hesitate to contact me if I can be of assistance to you.   Following are the goals we discussed today:  - Contact DSS as desired to complete Medicaid application - Remain in contact with you lawyer regarding concerns with settlement - Attend job interview Monday at 9am - Attend appointment with the Spine Specialist in August  Our next appointment is by telephone on 8/1 at 1:00 pm  Please call the care guide team at [phone] if you need to cancel or reschedule your appointment.   If you are experiencing a Mental Health or Behavioral Health Crisis or need someone to talk to, please call the Suicide and Crisis Lifeline: 988 go to [facility] Urgent [facility] [address], [place] [phone]) call 911  Patient verbalizes understanding of instructions and care plan provided today and agrees to view in MyChart. Active MyChart status and patient understanding of how to access instructions and care plan via MyChart confirmed with patient.     Bevelyn Ngo, BSW, CDP Social Worker, Certified Dementia Practitioner [facility] Care Management  Care Coordination [phone]

## 2022-10-16 ENCOUNTER — Telehealth: Payer: Self-pay

## 2022-10-16 NOTE — Patient Outreach (Signed)
  Care Coordination   10/16/2022 Name: LESHANDA CROUSE MRN: [id number] DOB: [birth year]   Care Coordination Outreach Attempts:  An unsuccessful telephone outreach was attempted for a scheduled appointment today.  Follow Up Plan:  Additional outreach attempts will be made to offer the patient care coordination information and services.   Encounter Outcome:  No Answer   Care Coordination Interventions:  Yes, provided    Bevelyn Ngo, BSW, CDP Social Worker, Certified Dementia Practitioner [facility] Care Management  Care Coordination [phone]

## 2022-10-20 ENCOUNTER — Telehealth: Payer: Self-pay

## 2022-10-20 NOTE — Patient Outreach (Signed)
  Care Coordination   10/20/2022 Name: Cynthia Clarke MRN: [id number] DOB: [birth year]   Care Coordination Outreach Attempts:  SW placed a second unsuccessful outbound call to the patient to follow up on care coordination needs. SW left a HIPAA compliant voice message requesting a return call.  Follow Up Plan:  Additional outreach attempts will be made to offer the patient care coordination information and services.   Encounter Outcome:  No Answer   Care Coordination Interventions:  No, not indicated    Bevelyn Ngo, BSW, CDP Social Worker, Certified Dementia Practitioner [facility] Care Management  Care Coordination [phone]

## 2022-10-22 ENCOUNTER — Ambulatory Visit: Payer: Self-pay

## 2022-10-22 NOTE — Patient Outreach (Signed)
  Care Coordination   10/22/2022 Name: LACORA CAMERA MRN: [id number] DOB: [birth year]   Care Coordination Outreach Attempts:  An unsuccessful telephone outreach was attempted today to offer the patient information about available care coordination services.  Follow Up Plan:  Additional outreach attempts will be made to offer the patient care coordination information and services.   Encounter Outcome:  No Answer   Care Coordination Interventions:  No, not indicated     Bary Leriche, RN, MSN [facility] Care Management Care Management Coordinator Direct Line [phone]

## 2022-10-23 NOTE — Patient Outreach (Signed)
  Care Coordination   10/23/2022 Name: Cynthia Clarke MRN: [id number] DOB: March 18, 1965   Care Coordination Outreach Attempts:  A third unsuccessful outreach was attempted today to offer the patient with information about available care coordination services.  Follow Up Plan:  No further outreach attempts will be made at this time. We have been unable to contact the patient to offer or enroll patient in care coordination services  Encounter Outcome:  No Answer   Care Coordination Interventions:  No, not indicated    Bevelyn Ngo, BSW, CDP Social Worker, Certified Dementia Practitioner [facility] Care Management  Care Coordination [phone]

## 2022-10-29 ENCOUNTER — Telehealth: Payer: Self-pay

## 2022-10-29 NOTE — Patient Outreach (Signed)
  Care Coordination   Follow Up Visit Note   10/29/2022 Name: Cynthia Clarke MRN: [id number] DOB: Mar 13, 1966  Cynthia Clarke is a 57 y.o. year old female who sees Red[place], Valrie Hart, MD for primary care. I spoke with  Cynthia Clarke by phone today.  What matters to the patients health and wellness today?  Keeping roof over her head    Goals Addressed             This Visit's Progress    Back injury due to fall and HTN       Care Coordination Interventions: Evaluation of current treatment plan related to recent fall and HTN and patient's adherence to plan as established by provider  Active listening / Reflection utilized  Emotional Support Provided  Patient continues to deal with back pain.  She is trying to work despite pain. 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 She did receive incident report from [employer].  Reports she sent incident to her lawyer. Taking ibuprofen for pain. Discussed pain control. Patient has not got over to apply for medicaid. Also advised on epass system to apply as well.         SDOH assessments and interventions completed:  Yes     Care Coordination Interventions:  Yes, provided   Follow up plan: Follow up call scheduled for September    Encounter Outcome:  Pt. Visit Completed   Bary Leriche, RN, MSN [facility] Care Management Care Management Coordinator Direct Line [phone]

## 2022-10-29 NOTE — Patient Instructions (Signed)
Visit Information  Thank you for taking time to visit with me today. Please don't hesitate to contact me if I can be of assistance to you.   Following are the goals we discussed today:   Goals Addressed             This Visit's Progress    Back injury due to fall and HTN       Care Coordination Interventions: Evaluation of current treatment plan related to recent fall and HTN and patient's adherence to plan as established by provider  Active listening / Reflection utilized  Emotional Support Provided  Patient continues to deal with back pain.  She is trying to work despite pain. 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 She did receive incident report from [employer].  Reports she sent incident to her lawyer. Taking ibuprofen for pain. Discussed pain control. Patient has not got over to apply for medicaid. Also advised on epass system to apply as well.         Our next appointment is by telephone on 12/09/22 at 1200 pm  Please call the care guide team at [phone] if you need to cancel or reschedule your appointment.   If you are experiencing a Mental Health or Behavioral Health Crisis or need someone to talk to, please call the Suicide and Crisis Lifeline: 988   Patient verbalizes understanding of instructions and care plan provided today and agrees to view in MyChart. Active MyChart status and patient understanding of how to access instructions and care plan via MyChart confirmed with patient.     The patient has been provided with contact information for the care management team and has been advised to call with any health related questions or concerns.   Bary Leriche, RN, MSN [facility] Care Management Care Management Coordinator Direct Line [phone]

## 2022-10-30 ENCOUNTER — Telehealth: Payer: Self-pay

## 2022-10-30 NOTE — Patient Outreach (Signed)
  Care Coordination   Follow Up Visit Note   10/30/2022 Name: Cynthia Clarke MRN: [id number] DOB: [birth year]  Cynthia Clarke is a 57 y.o. year old female who sees [place], Valrie Hart, MD for primary care. I spoke with  Cynthia Clarke by phone today.  What matters to the patients health and wellness today?  Patient would like to apply for Medicaid   SDOH assessments and interventions completed:  No     Care Coordination Interventions:  Yes, provided   Interventions Today    Flowsheet Row Most Recent Value  Chronic Disease   Chronic disease during today's visit Other  [Back Injury,  Financial Strain]  General Interventions   General Interventions Discussed/Reviewed [employer]  [Provided patient with address of DSS Outstation located at [facility] for patient to obtain assistance with applying for Medicaid. Pt reports she is going today as a walk-in]        Follow up plan: No further intervention required.   Encounter Outcome:  Pt. Visit Completed   Bevelyn Ngo, BSW, CDP Social Worker, Certified Dementia Practitioner [facility] Care Management  Care Coordination [phone]

## 2022-10-30 NOTE — Patient Instructions (Signed)
Visit Information  Thank you for taking time to visit with me today. Please don't hesitate to contact me if I can be of assistance to you.   Following are the goals we discussed today:  - Go to the DSS Outstation located at [address] to apply for Medicaid   If you are experiencing a Mental Health or Behavioral Health Crisis or need someone to talk to, please go to [facility] Urgent Care [address], [place] [phone]) call 911   Patient verbalizes understanding of instructions and care plan provided today and agrees to view in MyChart. Active MyChart status and patient understanding of how to access instructions and care plan via MyChart confirmed with patient.     No further follow up required: Please contact me as needed.  Bevelyn Ngo, BSW, CDP Social Worker, Certified Dementia Practitioner [facility] Care Management  Care Coordination [phone]

## 2022-11-06 ENCOUNTER — Ambulatory Visit: Payer: Self-pay

## 2022-11-06 NOTE — Patient Instructions (Signed)
Visit Information  Thank you for taking time to visit with me today. Please don't hesitate to contact me if I can be of assistance to you.    If you are experiencing a Mental Health or Behavioral Health Crisis or need someone to talk to, please call 1-800-273-TALK (toll free, 24 hour hotline) go to [facility] Urgent Care [address], [place] [phone]) call 911  Patient verbalizes understanding of instructions and care plan provided today and agrees to view in MyChart. Active MyChart status and patient understanding of how to access instructions and care plan via MyChart confirmed with patient.     No further follow up required: Please contact me as needed.  Bevelyn Ngo, BSW, CDP Social Worker, Certified Dementia Practitioner [facility] Care Management  Care Coordination [phone]

## 2022-11-06 NOTE — Patient Outreach (Signed)
  Care Coordination   Follow Up Visit Note   11/06/2022 Name: Cynthia Clarke MRN: [id number] DOB: 1965/03/21  Cynthia Clarke is a 57 y.o. year old female who sees Redding, Valrie Hart, MD for primary care. I spoke with  Cynthia Clarke by phone today.  What matters to the patients health and wellness today?  Patient is concerns with financial strain related to back injury   SDOH assessments and interventions completed:  No     Care Coordination Interventions:  Yes, provided   Interventions Today    Flowsheet Row Most Recent Value  Chronic Disease   Chronic disease during today's visit Other  [Back injury,  Financial Strain]  General Interventions   General Interventions Discussed/Reviewed [employer]  [Pt contacted SW to advise she completed her Medicaid application and was advised coverage will retroactive to cover medical expneses over the last three months]        Follow up plan: No further intervention required.   Encounter Outcome:  Pt. Visit Completed   Bevelyn Ngo, BSW, CDP Social Worker, Certified Dementia Practitioner [facility] Care Management  Care Coordination [phone]

## 2022-12-09 ENCOUNTER — Ambulatory Visit: Payer: Self-pay

## 2022-12-09 DIAGNOSIS — Z1211 Encounter for screening for malignant neoplasm of colon: Secondary | ICD-10-CM | POA: Diagnosis not present

## 2022-12-09 DIAGNOSIS — T192XXA Foreign body in vulva and vagina, initial encounter: Secondary | ICD-10-CM | POA: Diagnosis not present

## 2022-12-09 DIAGNOSIS — Z1231 Encounter for screening mammogram for malignant neoplasm of breast: Secondary | ICD-10-CM | POA: Diagnosis not present

## 2022-12-09 DIAGNOSIS — Z189 Retained foreign body fragments, unspecified material: Secondary | ICD-10-CM | POA: Diagnosis not present

## 2022-12-09 DIAGNOSIS — Z0000 Encounter for general adult medical examination without abnormal findings: Secondary | ICD-10-CM | POA: Diagnosis not present

## 2022-12-09 NOTE — Patient Outreach (Signed)
Care Coordination   Follow Up Visit Note   12/09/2022 Name: Cynthia Clarke MRN: [id number] DOB: 04-Oct-1965  Cynthia Clarke is a 57 y.o. year old female who sees Redding, Valrie Hart, MD for primary care. I spoke with  Cynthia Clarke by phone today.  What matters to the patients health and wellness today?  Preventive health    Goals Addressed             This Visit's Progress    Back injury due to fall and HTN       Care Coordination Interventions: Evaluation of current treatment plan related to recent fall and HTN and patient's adherence to plan as established by provider  Active listening / Reflection utilized  Emotional Support Provided  Patient continues to deal with back pain.  She reports pain in and out.  She is working again despite back pain.  Has muscle relaxer's to help.  Blood pressure continues to do okay. Last check 132/88. Saw PCP today. Referral to GYN, mammogram, and colonoscopy.  Discussed continued HTN Management.  No concerns.         SDOH assessments and interventions completed:  Yes     Care Coordination Interventions:  Yes, provided   Follow up plan: Follow up call scheduled for October    Encounter Outcome:  Patient Visit Completed   Bary Leriche, RN, MSN [place]  [facility], [facility] Management Community Coordinator Direct Dial: [phone]  Fax: [phone] Website: [email or web address]

## 2022-12-09 NOTE — Patient Instructions (Signed)
Visit Information  Thank you for taking time to visit with me today. Please don't hesitate to contact me if I can be of assistance to you.   Following are the goals we discussed today:   Goals Addressed             This Visit's Progress    Back injury due to fall and HTN       Care Coordination Interventions: Evaluation of current treatment plan related to recent fall and HTN and patient's adherence to plan as established by provider  Active listening / Reflection utilized  Emotional Support Provided  Patient continues to deal with back pain.  She reports pain in and out.  She is working again despite back pain.  Has muscle relaxer's to help.  Blood pressure continues to do okay. Last check 132/88. Saw PCP today. Referral to GYN, mammogram, and colonoscopy.  Discussed continued HTN Management.  No concerns.         Our next appointment is by telephone on 12/30/22 at 1200 pm  Please call the care guide team at [phone] if you need to cancel or reschedule your appointment.   If you are experiencing a Mental Health or Behavioral Health Crisis or need someone to talk to, please call the Suicide and Crisis Lifeline: 988   Patient verbalizes understanding of instructions and care plan provided today and agrees to view in MyChart. Active MyChart status and patient understanding of how to access instructions and care plan via MyChart confirmed with patient.     The patient has been provided with contact information for the care management team and has been advised to call with any health related questions or concerns.   Bary Leriche, RN, MSN [facility], [facility] Management Community Coordinator Direct Dial: [phone]  Fax: [phone] Website: [email or web address]

## 2022-12-10 DIAGNOSIS — N898 Other specified noninflammatory disorders of vagina: Secondary | ICD-10-CM | POA: Diagnosis not present

## 2022-12-10 DIAGNOSIS — Z30432 Encounter for removal of intrauterine contraceptive device: Secondary | ICD-10-CM | POA: Diagnosis not present

## 2022-12-10 DIAGNOSIS — Z113 Encounter for screening for infections with a predominantly sexual mode of transmission: Secondary | ICD-10-CM | POA: Diagnosis not present

## 2022-12-17 DIAGNOSIS — Z13220 Encounter for screening for lipoid disorders: Secondary | ICD-10-CM | POA: Diagnosis not present

## 2022-12-17 DIAGNOSIS — Z0000 Encounter for general adult medical examination without abnormal findings: Secondary | ICD-10-CM | POA: Diagnosis not present

## 2022-12-30 ENCOUNTER — Ambulatory Visit: Payer: Self-pay

## 2022-12-30 ENCOUNTER — Telehealth: Payer: Self-pay

## 2022-12-30 NOTE — Patient Outreach (Signed)
  Care Coordination   12/30/2022 Name: Cynthia Clarke MRN: [id number] DOB: February 13, 1966   Care Coordination Outreach Attempts:  An unsuccessful telephone outreach was attempted for a scheduled appointment today.  Follow Up Plan:  Additional outreach attempts will be made to offer the patient care coordination information and services.   Encounter Outcome:  No Answer    Care Coordination Interventions:  No, not indicated    Bary Leriche, RN, MSN [facility]  [facility], [facility] Management Community Coordinator Direct Dial: [phone]  Fax: [phone] Website: [email or web address]

## 2022-12-30 NOTE — Patient Outreach (Signed)
  Care Coordination   Follow Up Visit Note   12/30/2022 Name: Cynthia Clarke MRN: [id number] DOB: 1965/12/14  Cynthia Clarke is a 57 y.o. year old female who sees [place], Valrie Hart, MD for primary care. I spoke with  Cynthia Clarke by phone today.  What matters to the patients health and wellness today?  Manage back pain and work    Goals Addressed             This Visit's Progress    Back injury due to fall and HTN       Care Coordination Interventions: Evaluation of current treatment plan related to recent fall and HTN and patient's adherence to plan as established by provider  Active listening / Reflection utilized  Emotional Support Provided  Patient continues to deal with back pain.  She reports she has seen PCP for follow up and more muscle relaxer's.  To follow up next week for evaluation for possible therapy.  She continues to work despite back pain.  Blood pressure continues to do okay per patient.  Saw GYN, had mammogram, and has colonoscopy screening.   Encouraged continued HTN Management.  No concerns.         SDOH assessments and interventions completed:  Yes  SDOH Interventions Today    Flowsheet Row Most Recent Value  SDOH Interventions   Health Literacy Interventions Intervention Not Indicated        Care Coordination Interventions:  Yes, provided   Follow up plan: Follow up call scheduled for November    Encounter Outcome:  Patient Visit Completed   Bary Leriche, RN, MSN [place]  [facility], [facility] Management Community Coordinator Direct Dial: [phone]  Fax: [phone] Website: [email or web address]

## 2022-12-30 NOTE — Patient Instructions (Signed)
Visit Information  Thank you for taking time to visit with me today. Please don't hesitate to contact me if I can be of assistance to you.   Following are the goals we discussed today:   Goals Addressed             This Visit's Progress    Back injury due to fall and HTN       Care Coordination Interventions: Evaluation of current treatment plan related to recent fall and HTN and patient's adherence to plan as established by provider  Active listening / Reflection utilized  Emotional Support Provided  Patient continues to deal with back pain.  She reports she has seen PCP for follow up and more muscle relaxer's.  To follow up next week for evaluation for possible therapy.  She continues to work despite back pain.  Blood pressure continues to do okay per patient.  Saw GYN, had mammogram, and has colonoscopy screening.   Encouraged continued HTN Management.  No concerns.         Our next appointment is by telephone on 01/27/23 at 1200 pm  Please call the care guide team at [phone] if you need to cancel or reschedule your appointment.   If you are experiencing a Mental Health or Behavioral Health Crisis or need someone to talk to, please call the Suicide and Crisis Lifeline: 988   Patient verbalizes understanding of instructions and care plan provided today and agrees to view in MyChart. Active MyChart status and patient understanding of how to access instructions and care plan via MyChart confirmed with patient.     The patient has been provided with contact information for the care management team and has been advised to call with any health related questions or concerns.   Bary Leriche, RN, MSN [facility], [facility] Management Community Coordinator Direct Dial: [phone]  Fax: [phone] Website: [email or web address]

## 2023-01-02 DIAGNOSIS — H5213 Myopia, bilateral: Secondary | ICD-10-CM | POA: Diagnosis not present

## 2023-01-16 ENCOUNTER — Encounter: Payer: Self-pay | Admitting: Family Medicine

## 2023-01-19 ENCOUNTER — Other Ambulatory Visit: Payer: Self-pay | Admitting: Family Medicine

## 2023-01-19 DIAGNOSIS — Z1231 Encounter for screening mammogram for malignant neoplasm of breast: Secondary | ICD-10-CM

## 2023-01-27 ENCOUNTER — Ambulatory Visit: Payer: Self-pay

## 2023-01-27 NOTE — Patient Instructions (Signed)
Visit Information  Thank you for taking time to visit with me today. Please don't hesitate to contact me if I can be of assistance to you.   Following are the goals we discussed today:   Goals Addressed             This Visit's Progress    Back injury due to fall and HTN       Care Coordination Interventions: Evaluation of current treatment plan related to recent fall and HTN and patient's adherence to plan as established by provider  Active listening / Reflection utilized  Emotional Support Provided  Patient continues to deal with back pain.  She is now dealing with having to move.  She is working to get the rest of the money to the court today. Social work has given resources previously.  Blood pressure doing okay.  Reiterated continued HTN Management.  No concerns.         Our next appointment is by telephone on 03/30/22 at 1200 pm  Please call the care guide team at [phone] if you need to cancel or reschedule your appointment.   If you are experiencing a Mental Health or Behavioral Health Crisis or need someone to talk to, please call the Suicide and Crisis Lifeline: 988   Patient verbalizes understanding of instructions and care plan provided today and agrees to view in MyChart. Active MyChart status and patient understanding of how to access instructions and care plan via MyChart confirmed with patient.     The patient has been provided with contact information for the care management team and has been advised to call with any health related questions or concerns.   Bary Leriche, RN, MSN [facility], [facility] Management Community Coordinator Direct Dial: [phone]  Fax: [phone] Website: [email or web address]

## 2023-01-27 NOTE — Patient Outreach (Signed)
  Care Coordination   Follow Up Visit Note   01/28/2023 Name: Cynthia Clarke MRN: [id number] DOB: 14-Mar-1966  Cynthia Clarke is a 57 y.o. year old female who sees [place], Valrie Hart, MD for primary care. I spoke with  Cynthia Clarke by phone today.  What matters to the patients health and wellness today?  Keeping housing    Goals Addressed             This Visit's Progress    Back injury due to fall and HTN       Care Coordination Interventions: Evaluation of current treatment plan related to recent fall and HTN and patient's adherence to plan as established by provider  Active listening / Reflection utilized  Emotional Support Provided  Patient continues to deal with back pain.  She is now dealing with having to move.  She is working to get the rest of the money to the court today. Social work has given resources previously.  Blood pressure doing okay.  Reiterated continued HTN Management.  No concerns.   01/27/23 3:07 pm Patient reports she was able to get the money to settle court to keep her in her apartment.          SDOH assessments and interventions completed:  Yes     Care Coordination Interventions:  Yes, provided   Follow up plan: Follow up call scheduled for January    Encounter Outcome:  Patient Visit Completed   Bary Leriche, RN, MSN [place]  [facility], [facility] Management Community Coordinator Direct Dial: [phone]  Fax: [phone] Website: [email or web address]

## 2023-02-19 ENCOUNTER — Ambulatory Visit: Payer: 59

## 2023-03-19 DIAGNOSIS — Z01419 Encounter for gynecological examination (general) (routine) without abnormal findings: Secondary | ICD-10-CM | POA: Diagnosis not present

## 2023-03-19 DIAGNOSIS — Z1151 Encounter for screening for human papillomavirus (HPV): Secondary | ICD-10-CM | POA: Diagnosis not present

## 2023-03-19 DIAGNOSIS — Z124 Encounter for screening for malignant neoplasm of cervix: Secondary | ICD-10-CM | POA: Diagnosis not present

## 2023-03-19 DIAGNOSIS — Z8619 Personal history of other infectious and parasitic diseases: Secondary | ICD-10-CM | POA: Diagnosis not present

## 2023-03-19 DIAGNOSIS — N898 Other specified noninflammatory disorders of vagina: Secondary | ICD-10-CM | POA: Diagnosis not present

## 2023-03-19 LAB — HM PAP SMEAR

## 2023-03-25 ENCOUNTER — Ambulatory Visit
Admission: RE | Admit: 2023-03-25 | Discharge: 2023-03-25 | Disposition: A | Discharge status: Home / Self Care | Payer: 59 | Source: Ambulatory Visit | Attending: Family Medicine | Admitting: Family Medicine

## 2023-03-25 DIAGNOSIS — Z1231 Encounter for screening mammogram for malignant neoplasm of breast: Secondary | ICD-10-CM

## 2023-03-26 ENCOUNTER — Other Ambulatory Visit: Payer: Self-pay | Admitting: Family Medicine

## 2023-03-26 ENCOUNTER — Other Ambulatory Visit: Payer: Self-pay | Admitting: *Deleted

## 2023-03-26 DIAGNOSIS — R928 Other abnormal and inconclusive findings on diagnostic imaging of breast: Secondary | ICD-10-CM

## 2023-03-31 ENCOUNTER — Ambulatory Visit: Payer: Self-pay

## 2023-03-31 NOTE — Patient Outreach (Signed)
  Care Coordination   Follow Up Visit Note   03/31/2023 Name: VENNA BERBERICH MRN: [id number] DOB: 07-24-1965  Niels JONETTA Hatchet is a 58 y.o. year old female who sees [place], Norleen PHEBE HEATH, MD for primary care. I spoke with  Niels JONETTA Hatchet by phone today.  What matters to the patients health and wellness today?  Maintain health    Goals Addressed             This Visit's Progress    COMPLETED: Back injury due to fall and HTN       Care Coordination Interventions: Evaluation of current treatment plan related to recent fall and HTN and patient's adherence to plan as established by provider  Active listening / Reflection utilized  Emotional Support Provided  Patient doing good.  She reports doing preventive health.  HTN management continues.  Patient continues to meet goals.  RN CM will close case and patient agreeable.          SDOH assessments and interventions completed:  Yes     Care Coordination Interventions:  Yes, provided   Follow up plan: No further intervention required.   Encounter Outcome:  Patient Visit Completed   Delrae Hagey J Salimata Christenson, RN, MSN RN Care Manager [facility], Population Health Direct Dial: [phone]  Fax: [phone] Website: [email or web address]

## 2023-03-31 NOTE — Patient Instructions (Signed)
 Visit Information  Thank you for taking time to visit with me today. Please don't hesitate to contact me if I can be of assistance to you.   Following are the goals we discussed today:   Goals Addressed             This Visit's Progress    COMPLETED: Back injury due to fall and HTN       Care Coordination Interventions: Evaluation of current treatment plan related to recent fall and HTN and patient's adherence to plan as established by provider  Active listening / Reflection utilized  Emotional Support Provided  Patient doing good.  She reports doing preventive health.  HTN management continues.  Patient continues to meet goals.  RN CM will close case and patient agreeable.          If you are experiencing a Mental Health or Behavioral Health Crisis or need someone to talk to, please call the Suicide and Crisis Lifeline: 988   Patient verbalizes understanding of instructions and care plan provided today and agrees to view in MyChart. Active MyChart status and patient understanding of how to access instructions and care plan via MyChart confirmed with patient.     The patient has been provided with contact information for the care management team and has been advised to call with any health related questions or concerns.   Cortlandt Capuano J Quay Simkin, RN, MSN RN Care Manager [facility], Population Health Direct Dial: [phone]  Fax: [phone] Website: [email or web address]

## 2023-04-13 ENCOUNTER — Ambulatory Visit
Admission: RE | Admit: 2023-04-13 | Discharge: 2023-04-13 | Disposition: A | Discharge status: Home / Self Care | Payer: 59 | Source: Ambulatory Visit | Attending: Family Medicine | Admitting: Family Medicine

## 2023-04-13 DIAGNOSIS — R928 Other abnormal and inconclusive findings on diagnostic imaging of breast: Secondary | ICD-10-CM

## 2023-04-13 LAB — HM MAMMOGRAPHY

## 2023-06-02 DIAGNOSIS — H5203 Hypermetropia, bilateral: Secondary | ICD-10-CM | POA: Diagnosis not present

## 2023-08-28 ENCOUNTER — Encounter (HOSPITAL_BASED_OUTPATIENT_CLINIC_OR_DEPARTMENT_OTHER): Payer: Self-pay | Admitting: Family Medicine
